# Patient Record
Sex: Male | Born: 1985 | Race: White | Hispanic: No | Marital: Single | Smoking: Current every day smoker
Health system: Southern US, Community
[De-identification: ages and names within clinical notes are randomized; demographics above are authoritative.]

## PROBLEM LIST (undated history)

## (undated) DIAGNOSIS — G4733 Obstructive sleep apnea (adult) (pediatric): Secondary | ICD-10-CM

## (undated) DIAGNOSIS — I10 Essential (primary) hypertension: Secondary | ICD-10-CM

## (undated) DIAGNOSIS — M25562 Pain in left knee: Secondary | ICD-10-CM

## (undated) DIAGNOSIS — R0683 Snoring: Secondary | ICD-10-CM

## (undated) HISTORY — DX: Morbid (severe) obesity due to excess calories: E66.01

## (undated) HISTORY — DX: Snoring: R06.83

## (undated) HISTORY — DX: Pain in left knee: M25.562

---

## 2016-08-08 ENCOUNTER — Ambulatory Visit: Payer: Self-pay | Admitting: Osteopathic Medicine

## 2016-08-10 ENCOUNTER — Ambulatory Visit (INDEPENDENT_AMBULATORY_CARE_PROVIDER_SITE_OTHER): Payer: Managed Care, Other (non HMO) | Admitting: Osteopathic Medicine

## 2016-08-10 ENCOUNTER — Encounter: Payer: Self-pay | Admitting: Osteopathic Medicine

## 2016-08-10 VITALS — BP 128/86 | HR 91 | Ht 67.0 in | Wt 282.0 lb

## 2016-08-10 DIAGNOSIS — Z131 Encounter for screening for diabetes mellitus: Secondary | ICD-10-CM | POA: Diagnosis not present

## 2016-08-10 DIAGNOSIS — Z23 Encounter for immunization: Secondary | ICD-10-CM | POA: Insufficient documentation

## 2016-08-10 DIAGNOSIS — Z13220 Encounter for screening for lipoid disorders: Secondary | ICD-10-CM | POA: Diagnosis not present

## 2016-08-10 DIAGNOSIS — Z0000 Encounter for general adult medical examination without abnormal findings: Secondary | ICD-10-CM | POA: Diagnosis not present

## 2016-08-10 DIAGNOSIS — M25562 Pain in left knee: Secondary | ICD-10-CM

## 2016-08-10 DIAGNOSIS — R0981 Nasal congestion: Secondary | ICD-10-CM | POA: Insufficient documentation

## 2016-08-10 DIAGNOSIS — R0683 Snoring: Secondary | ICD-10-CM

## 2016-08-10 DIAGNOSIS — Z113 Encounter for screening for infections with a predominantly sexual mode of transmission: Secondary | ICD-10-CM | POA: Insufficient documentation

## 2016-08-10 HISTORY — DX: Morbid (severe) obesity due to excess calories: E66.01

## 2016-08-10 HISTORY — DX: Pain in left knee: M25.562

## 2016-08-10 HISTORY — DX: Snoring: R06.83

## 2016-08-10 NOTE — Progress Notes (Signed)
HPI: Karen ChafeCharles Sobczak is a 30 y.o. male  who presents to [facility] [place] today, 08/10/16,  for chief complaint of:  Chief Complaint  Patient presents with  . Establish Care    Preventive care reviewed as below  Brief discussion of other medical issues:   Hx Left knee pain, popped in 2012, XR 2016 was negative in ER. Bother him intermittently. Mobic helped a bit for maybe a week.   Snoring and Sinus Congestion - thinks might be related, would like to know if anything he can take for sinuses.     Past medical, surgical, social and family history reviewed: History reviewed. No pertinent past medical history. History reviewed. No pertinent surgical history. Social History  Substance Use Topics  . Smoking status: Current Every Day Smoker    Types: Pipe  . Smokeless tobacco: Never Used  . Alcohol use Not on file   History reviewed. No pertinent family history.   Current medication list and allergy/intolerance information reviewed:   No current outpatient prescriptions on file.   No current facility-administered medications for this visit.    No Known Allergies    Review of Systems:  Constitutional:  No  fever, no chills, No recent illness, No unintentional weight changes. No significant fatigue.   HEENT: No  headache, no vision change, no hearing change, No sore throat, (+) sinus pressure  Cardiac: No  chest pain, No  pressure, No palpitations, No  Orthopnea  Respiratory:  No  shortness of breath. No  Cough  Gastrointestinal: No  abdominal pain, No  nausea, No  vomiting,  No  blood in stool, No  diarrhea, No  constipation   Musculoskeletal: No new myalgia/arthralgia  Genitourinary: No  incontinence, No  abnormal genital bleeding, No abnormal genital discharge  Skin: No  Rash, No other wounds/concerning lesions  Hem/Onc: No  easy bruising/bleeding, No  abnormal lymph node  Endocrine: No cold intolerance,  No heat intolerance. No  polyuria/polydipsia/polyphagia   Neurologic: No  weakness, No  dizziness, No  slurred speech/focal weakness/facial droop  Psychiatric: No  concerns with depression, No  concerns with anxiety, No sleep problems, No mood problems  Exam:  BP 128/86   Pulse 91   Ht 5\' 7"  (1.702 m)   Wt 282 lb (127.9 kg)   BMI 44.17 kg/m   Constitutional: VS see above. General Appearance: alert, well-developed, well-nourished, NAD  Eyes: Normal lids and conjunctive, non-icteric sclera  Ears, Nose, Mouth, Throat: MMM, Normal external inspection ears/nares/mouth/lips/gums. TM normal bilaterally. Pharynx/tonsils no erythema, no exudate. Nasal mucosa normal.   Neck: No masses, trachea midline. No thyroid enlargement. No tenderness/mass appreciated. No lymphadenopathy  Respiratory: Normal respiratory effort. no wheeze, no rhonchi, no rales  Cardiovascular: S1/S2 normal, no murmur, no rub/gallop auscultated. RRR. No lower extremity edema. Pedal pulse II/IV bilaterally DP and PT. No carotid bruit or JVD. No abdominal aortic bruit.  Gastrointestinal: Nontender, no masses. No hepatomegaly, no splenomegaly. No hernia appreciated. Bowel sounds normal. Rectal exam deferred.   Musculoskeletal: Gait normal. No clubbing/cyanosis of digits. L knee no crepitus, normal ROM, ligaments stable to ant/post drawers and varus/valgus stress  Neurological: No cranial nerve deficit on limited exam. Motor and sensation intact and symmetric. Cerebellar reflexes intact. Normal balance/coordination. No tremor.   Skin: warm, dry, intact. No rash/ulcer. No concerning nevi or subq nodules on limited exam.    Psychiatric: Normal judgment/insight. Normal mood and affect. Oriented x3.      ASSESSMENT/PLAN:   Annual physical exam -  Plan: CBC with Differential/Platelet, Hemoglobin A1c, COMPLETE METABOLIC PANEL WITH GFR, HIV antibody, Lipid panel, TSH  Morbid obesity, unspecified obesity type (HCC) - Plan: Hemoglobin A1c, Lipid panel,  TSH  Lipid screening  Diabetes mellitus screening  Snoring  Sinus congestion  Need for diphtheria-tetanus-pertussis (Tdap) vaccine, adult/adolescent - Plan: Tdap vaccine greater than or equal to 7yo IM  Routine screening for STI (sexually transmitted infection) - Plan: GC/Chlamydia Probe Amp  Left knee pain   Patient Instructions  Sinus: try Flonase or Nasonex or one of their generic equivalents, +/- Claritin or other over-the-counter antihistamine  Snoring: Let's see how sinus treatment helps, if snoring no better, may need to consider sleep study. Weight loss will help reduce snoring as well because it will reduce the pressure on your lungs and upper body.   Smoking: Come see [place] if you'd like help quitting!  Knee pain: If persists or gets worse, recommend follow up with one of our sport medicine docs, Dr Denyse Amass or Dr. Karie Schwalbe.      MALE PREVENTIVE CARE updated 08/10/16  ANNUAL SCREENING/COUNSELING  Any changes to health in the past year? no  Tobacco - yes - usually during   Alcohol - social drinker  Diet/Exercise - Healthy habits discussed to decrease CV risk and promote overall health. Patient does not have dietary restrictions.   Depression - PQH2 Negative  Feel safe at home? - yes  HTN SCREENING - SEE VITALS  SEXUAL/REPRODUCTIVE HEALTH  Sexually active? - Yes with male.  STI testing needed/desired today? - yes  Any concerns with testosterone/libido? - no  INFECTIOUS DISEASE SCREENING  HIV - needs  GC/CT - needs  HepC - does not need  TB - does not need  CANCER SCREENING  Lung - does not need  Colon - does not need  Prostate - does not need  OTHER DISEASE SCREENING  Lipid - needs  DM2 - needs  Osteoporosis - does not need  ADULT VACCINATION  Influenza - needs today, annual vaccine recommended  Td - was given  Zoster - was not indicated  Pneumonia - was not indicated     Visit summary with medication list and pertinent  instructions was printed for patient to review. All questions at time of visit were answered - patient instructed to contact office with any additional concerns. ER/RTC precautions were reviewed with the patient. Follow-up plan: Return in about 1 year (around 08/10/2017), or sooner if needed and/or if any concerning abnormalities on labs which require follow-up, for ANNUAL PHYSICAL.

## 2016-08-10 NOTE — Patient Instructions (Signed)
Sinus: try Flonase or Nasonex or one of their generic equivalents, +/- Claritin or other over-the-counter antihistamine  Snoring: Let's see how sinus treatment helps, if snoring no better, may need to consider sleep study. Weight loss will help reduce snoring as well because it will reduce the pressure on your lungs and upper body.   Smoking: Come see us if you'd like help quitting!  Knee pain: If persists or gets worse, recommend follow up with one of our sport medicine docs, Dr Denyse Amassorey or Dr. TKarie Schwalbe

## 2016-08-11 LAB — COMPLETE METABOLIC PANEL WITH GFR
ALBUMIN: 4.4 g/dL (ref 3.6–5.1)
ALK PHOS: 62 U/L (ref 40–115)
ALT: 32 U/L (ref 9–46)
AST: 20 U/L (ref 10–40)
BILIRUBIN TOTAL: 0.4 mg/dL (ref 0.2–1.2)
BUN: 15 mg/dL (ref 7–25)
CO2: 23 mmol/L (ref 20–31)
CREATININE: 0.99 mg/dL (ref 0.60–1.35)
Calcium: 9.4 mg/dL (ref 8.6–10.3)
Chloride: 106 mmol/L (ref 98–110)
GFR, Est African American: >89 mL/min (ref >=60)
GFR, Est Non African American: >89 mL/min (ref >=60)
GLUCOSE: 91 mg/dL (ref 65–99)
POTASSIUM: 4.1 mmol/L (ref 3.5–5.3)
SODIUM: 140 mmol/L (ref 135–146)
TOTAL PROTEIN: 6.9 g/dL (ref 6.1–8.1)

## 2016-08-11 LAB — CBC WITH DIFFERENTIAL/PLATELET
BASOS ABS: 93 {cells}/uL (ref 0–200)
BASOS PCT: 1 %
EOS ABS: 372 {cells}/uL (ref 15–500)
Eosinophils Relative: 4 %
HEMATOCRIT: 47.2 % (ref 38.5–50.0)
HEMOGLOBIN: 16.1 g/dL (ref 13.2–17.1)
Lymphocytes Relative: 30 %
Lymphs Abs: 2790 cells/uL (ref 850–3900)
MCH: 30.8 pg (ref 27.0–33.0)
MCHC: 34.1 g/dL (ref 32.0–36.0)
MCV: 90.4 fL (ref 80.0–100.0)
MONO ABS: 744 {cells}/uL (ref 200–950)
MPV: 13.1 fL — AB (ref 7.5–12.5)
Monocytes Relative: 8 %
NEUTROS ABS: 5301 {cells}/uL (ref 1500–7800)
Neutrophils Relative %: 57 %
Platelets: 187 10*3/uL (ref 140–400)
RBC: 5.22 MIL/uL (ref 4.20–5.80)
RDW: 13.1 % (ref 11.0–15.0)
WBC: 9.3 10*3/uL (ref 3.8–10.8)

## 2016-08-11 LAB — LIPID PANEL
Cholesterol: 167 mg/dL (ref 125–200)
HDL: 39 mg/dL — AB (ref >=40)
LDL CALC: 98 mg/dL (ref <130)
Total CHOL/HDL Ratio: 4.3 Ratio (ref <=5.0)
Triglycerides: 149 mg/dL (ref <150)
VLDL: 30 mg/dL (ref <30)

## 2016-08-11 LAB — GC/CHLAMYDIA PROBE AMP
CT PROBE, AMP APTIMA: NOT DETECTED
GC Probe RNA: NOT DETECTED

## 2016-08-11 LAB — HEMOGLOBIN A1C
Hgb A1c MFr Bld: 4.9 % (ref <5.7)
Mean Plasma Glucose: 94 mg/dL

## 2016-08-11 LAB — HIV ANTIBODY (ROUTINE TESTING W REFLEX): HIV 1&2 Ab, 4th Generation: NONREACTIVE

## 2016-08-11 LAB — TSH: TSH: 2.92 m[IU]/L (ref 0.40–4.50)

## 2017-02-13 ENCOUNTER — Telehealth: Payer: Self-pay | Admitting: Osteopathic Medicine

## 2017-02-13 NOTE — Telephone Encounter (Signed)
Pt declined flu shot °

## 2017-09-04 ENCOUNTER — Ambulatory Visit (INDEPENDENT_AMBULATORY_CARE_PROVIDER_SITE_OTHER): Payer: Managed Care, Other (non HMO) | Admitting: Osteopathic Medicine

## 2017-09-04 ENCOUNTER — Encounter: Payer: Self-pay | Admitting: Osteopathic Medicine

## 2017-09-04 VITALS — BP 145/87 | HR 96 | Temp 98.5°F | Ht 67.0 in | Wt 285.0 lb

## 2017-09-04 DIAGNOSIS — R9431 Abnormal electrocardiogram [ECG] [EKG]: Secondary | ICD-10-CM | POA: Diagnosis not present

## 2017-09-04 DIAGNOSIS — R0789 Other chest pain: Secondary | ICD-10-CM

## 2017-09-04 DIAGNOSIS — J208 Acute bronchitis due to other specified organisms: Secondary | ICD-10-CM | POA: Diagnosis not present

## 2017-09-04 LAB — TSH: TSH: 2.63 mIU/L (ref 0.40–4.50)

## 2017-09-04 LAB — COMPLETE METABOLIC PANEL WITH GFR
AG RATIO: 1.8 (calc) (ref 1.0–2.5)
ALBUMIN MSPROF: 4.6 g/dL (ref 3.6–5.1)
ALT: 44 U/L (ref 9–46)
AST: 21 U/L (ref 10–40)
Alkaline phosphatase (APISO): 68 U/L (ref 40–115)
BUN: 10 mg/dL (ref 7–25)
CALCIUM: 10.1 mg/dL (ref 8.6–10.3)
CO2: 28 mmol/L (ref 20–32)
CREATININE: 0.92 mg/dL (ref 0.60–1.35)
Chloride: 100 mmol/L (ref 98–110)
GFR, EST NON AFRICAN AMERICAN: 109 mL/min/{1.73_m2} (ref >=60)
GFR, Est African American: 126 mL/min/{1.73_m2} (ref >=60)
GLOBULIN: 2.5 g/dL (ref 1.9–3.7)
Glucose, Bld: 89 mg/dL (ref 65–99)
POTASSIUM: 4.5 mmol/L (ref 3.5–5.3)
SODIUM: 138 mmol/L (ref 135–146)
Total Bilirubin: 0.7 mg/dL (ref 0.2–1.2)
Total Protein: 7.1 g/dL (ref 6.1–8.1)

## 2017-09-04 LAB — CBC WITH DIFFERENTIAL/PLATELET
BASOS ABS: 82 {cells}/uL (ref 0–200)
Basophils Relative: 0.6 %
Eosinophils Absolute: 247 cells/uL (ref 15–500)
Eosinophils Relative: 1.8 %
HEMATOCRIT: 49.5 % (ref 38.5–50.0)
HEMOGLOBIN: 17 g/dL (ref 13.2–17.1)
LYMPHS ABS: 1754 {cells}/uL (ref 850–3900)
MCH: 31 pg (ref 27.0–33.0)
MCHC: 34.3 g/dL (ref 32.0–36.0)
MCV: 90.3 fL (ref 80.0–100.0)
MPV: 13.2 fL — ABNORMAL HIGH (ref 7.5–12.5)
Monocytes Relative: 8.9 %
NEUTROS ABS: 10398 {cells}/uL — AB (ref 1500–7800)
NEUTROS PCT: 75.9 %
Platelets: 206 10*3/uL (ref 140–400)
RBC: 5.48 10*6/uL (ref 4.20–5.80)
RDW: 12.1 % (ref 11.0–15.0)
Total Lymphocyte: 12.8 %
WBC: 13.7 10*3/uL — ABNORMAL HIGH (ref 3.8–10.8)
WBCMIX: 1219 {cells}/uL — AB (ref 200–950)

## 2017-09-04 LAB — TROPONIN I: Troponin I: <0.01 ng/mL (ref <=0.0)

## 2017-09-04 MED ORDER — GUAIFENESIN-CODEINE 100-10 MG/5ML PO SOLN: 5.0000 mL | Freq: Four times a day (QID) | ORAL | 0 refills | Status: DC | PRN

## 2017-09-04 MED ORDER — IPRATROPIUM BROMIDE 0.06 % NA SOLN: 2.0000 | Freq: Four times a day (QID) | NASAL | 12 refills | Status: DC

## 2017-09-04 MED ORDER — PREDNISONE 20 MG PO TABS: 20.0000 mg | ORAL_TABLET | Freq: Two times a day (BID) | ORAL | 0 refills | Status: DC

## 2017-09-04 NOTE — Progress Notes (Signed)
HPI: Blake Mcdaniel is a 31 y.o. male who presents to [facility] 09/04/17 for chief complaint of:  Chief Complaint  Patient presents with  . Cough    Acute Illness: . Context: similar illness in partner . Location/Quality: coughing, mostly dry, occasional phlegm . Assoc signs/symptoms: chest pain radiating into L arm last night, he states this pain comes and goes past few months  . Duration: 1 days . Modifying factors: has tried the following OTC/Rx medications: Tylenol Cold.    Past medical, social and family history reviewed.  Immune compromising conditions or other risk factors: smoker  Current medications and allergies reviewed.     Review of Systems:  Constitutional: No  fever/chills  HEENT: sinue headache, No  sore throat, No  swollen glands  Cardiovascular: +chest pain  Respiratory:Yes  cough, No  shortness of breath  Gastrointestinal: No  nausea, No  vomiting,  No  diarrhea  Musculoskeletal:   No  myalgia/arthralgia  Skin/Integument:  No  rash   Detailed Exam:  BP (!) 145/87   Pulse 96   Temp 98.5 F (36.9 C) (Oral)   Ht  (1.702 m)   Wt 285 lb (129.3 kg)   SpO2 98%   BMI 44.64 kg/m   Constitutional:   VSS, see above.   General Appearance: alert, well-developed, well-nourished, NAD  Eyes:   Normal lids and conjunctive, non-icteric sclera  Ears, Nose, Mouth, Throat:   Normal external inspection ears/nares  Normal mouth/lips/gums, MMM  normal TM  posterior pharynx without erythema, without exudate  nasal mucosa normal  Skin:  Normal inspection, no rash or concerning lesions noted on limited exam  Neck:   No masses, trachea midline. normal lymph nodes  Respiratory:   Normal respiratory effort.   No  wheeze/rhonchi/rales  Cardiovascular:   S1/S2 normal, no murmur/rub/gallop auscultated. RRR.    EKG interpretation: Rate: 94 Rhythm: sinus No previous EKG available for  comparison (+)T inversion I, aVL       ASSESSMENT/PLAN:  Acute viral bronchitis - Plan: ipratropium (ATROVENT) 0.06 % nasal spray, predniSONE (DELTASONE) 20 MG tablet, guaiFENesin-codeine 100-10 MG/5ML syrup  Abnormal EKG - Plan: Troponin I, Ambulatory referral to Cardiology  Other chest pain - Plan: CBC with Differential/Platelet, COMPLETE METABOLIC PANEL WITH GFR, TSH, EKG 12-Lead     Patient Instructions  Plan:  Viral bronchitis - treat symptoms and wait for illness to improve on its own in 5-7 days. Cough medicine, steroids and nasal spray. See below for list of OTC treatments and other home remedies. WASH HANDS OFTEN, ALWAYS COVER COUGH!   EKG today not totally normal, but I don't have any other EKG's on file for you for a comparison. Since you've been experiencing chest pain on and off, I'm going to place a referral to cardiology to evaluate for stress test and we should get some labs today. If labs are abnormal, or if you experience worsening chest pains, go to ER immediately!     Over-the-Counter Medications & Home Remedies for Upper Respiratory Illness  Note: the following list assumes no pregnancy, normal liver & kidney function and no other drug interactions. Dr. Lyn Hollingshead has highlighted medications which are safe for you to use, but these may not be appropriate for everyone. Always ask a pharmacist or qualified medical provider if you have any questions!   Aches/Pains, Fever, Headache Acetaminophen (Tylenol) 500 mg tablets - take max 2 tablets (1000 mg) every 6 hours (4 times per day)  Ibuprofen (Motrin)  200 mg tablets - take max 4 tablets (800 mg) every 6 hours*  Sinus Congestion Prescription Atrovent as directed Nasal Saline if desired - for rinsing Oxymetolazone (Afrin, others) sparing use due to rebound congestion, NEVER use in kids Phenylephrine (Sudafed) 10 mg tablets every 4 hours (or the 12-hour formulation)* Diphenhydramine (Benadryl) 25 mg tablets - take  max 2 tablets every 4 hours  Cough & Sore Throat Prescription cough pills or syrups as directed OR Dextromethorphan (Robitussin, others) - cough suppressant Guaifenesin (Robitussin, Mucinex, others) - expectorant (helps cough up mucus) (Dextromethorphan and Guaifenesin also come in a combination tablet) Other helpful cough remedies:  Lozenges w/ Benzocaine + Menthol (Cepacol) Honey - as much as you want! Teas which "coat the throat" - look for ingredients Elm Bark, Licorice Root, Marshmallow Root  Other Antibiotics if these are prescribed - take ALL, even if you're feeling better  Zinc Lozenges within 24 hours of symptoms onset - mixed evidence this shortens the duration of the common cold Don't waste your money on Vitamin C or Echinacea  *Caution in patients with high blood pressure       Visit summary was printed for the patient with medications and pertinent instructions for patient to review. ER/RTC precautions reviewed. All questions answered. Return if symptoms worsen or fail to improve, and recheck chest symptoms/EKG in one week .

## 2017-09-04 NOTE — Patient Instructions (Addendum)
Plan:  Viral bronchitis - treat symptoms and wait for illness to improve on its own in 5-7 days. Cough medicine, steroids and nasal spray. See below for list of OTC treatments and other home remedies. WASH HANDS OFTEN, ALWAYS COVER COUGH!   EKG today not totally normal, but I don't have any other EKG's on file for you for a comparison. Since you've been experiencing chest pain on and off, I'm going to place a referral to cardiology to evaluate for stress test and we should get some labs today. If labs are abnormal, or if you experience worsening chest pains, go to ER immediately!     Over-the-Counter Medications & Home Remedies for Upper Respiratory Illness  Note: the following list assumes no pregnancy, normal liver & kidney function and no other drug interactions. Dr. Lyn Hollingshead has highlighted medications which are safe for you to use, but these may not be appropriate for everyone. Always ask a pharmacist or qualified medical provider if you have any questions!   Aches/Pains, Fever, Headache Acetaminophen (Tylenol) 500 mg tablets - take max 2 tablets (1000 mg) every 6 hours (4 times per day)  Ibuprofen (Motrin) 200 mg tablets - take max 4 tablets (800 mg) every 6 hours*  Sinus Congestion Prescription Atrovent as directed Nasal Saline if desired - for rinsing Oxymetolazone (Afrin, others) sparing use due to rebound congestion, NEVER use in kids Phenylephrine (Sudafed) 10 mg tablets every 4 hours (or the 12-hour formulation)* Diphenhydramine (Benadryl) 25 mg tablets - take max 2 tablets every 4 hours  Cough & Sore Throat Prescription cough pills or syrups as directed OR Dextromethorphan (Robitussin, others) - cough suppressant Guaifenesin (Robitussin, Mucinex, others) - expectorant (helps cough up mucus) (Dextromethorphan and Guaifenesin also come in a combination tablet) Other helpful cough remedies:  Lozenges w/ Benzocaine + Menthol (Cepacol) Honey - as much as you want! Teas which  "coat the throat" - look for ingredients Elm Bark, Licorice Root, Marshmallow Root  Other Antibiotics if these are prescribed - take ALL, even if you're feeling better  Zinc Lozenges within 24 hours of symptoms onset - mixed evidence this shortens the duration of the common cold Don't waste your money on Vitamin C or Echinacea  *Caution in patients with high blood pressure

## 2017-09-11 ENCOUNTER — Ambulatory Visit: Payer: Managed Care, Other (non HMO) | Admitting: Cardiology

## 2018-01-17 ENCOUNTER — Ambulatory Visit (INDEPENDENT_AMBULATORY_CARE_PROVIDER_SITE_OTHER): Payer: Managed Care, Other (non HMO) | Admitting: Osteopathic Medicine

## 2018-01-17 ENCOUNTER — Encounter: Payer: Self-pay | Admitting: Osteopathic Medicine

## 2018-01-17 VITALS — BP 128/87 | HR 92 | Temp 98.1°F | Wt 303.0 lb

## 2018-01-17 DIAGNOSIS — J208 Acute bronchitis due to other specified organisms: Secondary | ICD-10-CM

## 2018-01-17 DIAGNOSIS — Z720 Tobacco use: Secondary | ICD-10-CM

## 2018-01-17 DIAGNOSIS — J069 Acute upper respiratory infection, unspecified: Secondary | ICD-10-CM | POA: Diagnosis not present

## 2018-01-17 DIAGNOSIS — B9789 Other viral agents as the cause of diseases classified elsewhere: Secondary | ICD-10-CM

## 2018-01-17 MED ORDER — GUAIFENESIN-CODEINE 100-10 MG/5ML PO SOLN: 5.0000 mL | Freq: Four times a day (QID) | ORAL | 0 refills | Status: DC | PRN

## 2018-01-17 MED ORDER — AZITHROMYCIN 250 MG PO TABS: ORAL_TABLET | ORAL | 0 refills | Status: DC

## 2018-01-17 MED ORDER — IPRATROPIUM BROMIDE 0.06 % NA SOLN: 2.0000 | Freq: Four times a day (QID) | NASAL | 1 refills | Status: DC

## 2018-01-17 MED ORDER — PREDNISONE 20 MG PO TABS: 20.0000 mg | ORAL_TABLET | Freq: Two times a day (BID) | ORAL | 0 refills | Status: DC

## 2018-01-17 NOTE — Patient Instructions (Signed)
Acute Bronchitis, Adult Acute bronchitis is sudden (acute) swelling of the air tubes (bronchi) in the lungs. Acute bronchitis causes these tubes to fill with mucus, which can make it hard to breathe. It can also cause coughing or wheezing. In adults, acute bronchitis usually goes away within 2 weeks. A cough caused by bronchitis may last up to 3 weeks. Smoking, allergies, and asthma can make the condition worse. Repeated episodes of bronchitis may cause further lung problems, such as chronic obstructive pulmonary disease (COPD). What are the causes? This condition can be caused by germs and by substances that irritate the lungs, including:  Cold and flu viruses. This condition is most often caused by the same virus that causes a cold.  Bacteria.  Exposure to tobacco smoke, dust, fumes, and air pollution.  What increases the risk? This condition is more likely to develop in people who:  Have close contact with someone with acute bronchitis.  Are exposed to lung irritants, such as tobacco smoke, dust, fumes, and vapors.  Have a weak immune system.  Have a respiratory condition such as asthma.  What are the signs or symptoms? Symptoms of this condition include:  A cough.  Coughing up clear, yellow, or green mucus.  Wheezing.  Chest congestion.  Shortness of breath.  A fever.  Body aches.  Chills.  A sore throat.  How is this diagnosed?  This condition is usually diagnosed with a physical exam. During the exam, your health care provider may order tests, such as chest X-rays, to rule out other conditions.   Your health care provider will also ask about your symptoms and medical history. How is this treated? Most cases of acute bronchitis clear up over time without treatment. Your health care provider may recommend:  Drinking more fluids. Drinking more makes your mucus thinner, which may make it easier to breathe.  Taking a medicine for a fever or cough.  Taking an  antibiotic medicine.  Using an inhaler to help improve shortness of breath and to control a cough.  Using a cool mist vaporizer or humidifier to make it easier to breathe.  Follow these instructions at home: Medicines  Take over-the-counter and prescription medicines only as told by your health care provider.  If you were prescribed an antibiotic, take it as told by your health care provider. Do not stop taking the antibiotic even if you start to feel better. General instructions  Get plenty of rest.  Drink enough fluids to keep your urine clear or pale yellow.  Avoid smoking and secondhand smoke. Exposure to cigarette smoke or irritating chemicals will make bronchitis worse. If you smoke and you need help quitting, ask your health care provider. Quitting smoking will help your lungs heal faster.  Use an inhaler, cool mist vaporizer, or humidifier as told by your health care provider.  Keep all follow-up visits as told by your health care provider. This is important. How is this prevented? To lower your risk of getting this condition again:  Wash your hands often with soap and water. If soap and water are not available, use hand sanitizer.  Avoid contact with people who have cold symptoms.  Try not to touch your hands to your mouth, nose, or eyes.  Make sure to get the flu shot every year.  Contact a health care provider if:  Your symptoms do not improve in 2 weeks of treatment. Get help right away if:  You cough up blood.  You have chest pain.  You  have severe shortness of breath.  You become dehydrated.  You faint or keep feeling like you are going to faint.  You keep vomiting.  You have a severe headache.  Your fever or chills gets worse. This information is not intended to replace advice given to you by your health care provider. Make sure you discuss any questions you have with your health care provider. Document Released: 01/11/2005 Document Revised:  06/28/2016 Document Reviewed: 05/24/2016 [employer] Interactive Patient Education  [employer]2018 [employer]

## 2018-01-17 NOTE — Progress Notes (Signed)
HPI: Blake Mcdaniel is a 32 y.o. male who  has a past medical history of Left knee pain (08/10/2016), Morbid obesity (HCC) (08/10/2016), and Snoring (08/10/2016).  he presents to [facility] today, 01/17/18,  for chief complaint of: Feeling sick  Cough for a few days, sick contacts at work. Nasal congestion. No fever that he has measured. Still (+)smoking 1/2 ppd. Sore throat, sinus congestion.     Past medical, surgical, social and family history reviewed:  Patient Active Problem List   Diagnosis Date Noted  . Annual physical exam 08/10/2016  . Morbid obesity (HCC) 08/10/2016  . Snoring 08/10/2016  . Left knee pain 08/10/2016   No past surgical history on file.  Social History   Tobacco Use  . Smoking status: Current Every Day Smoker    Types: Pipe  . Smokeless tobacco: Never Used  Substance Use Topics  . Alcohol use: Not on file    Family History  Problem Relation Age of Onset  . Heart attack Maternal Uncle   . Diabetes Maternal Grandfather      Current medication list and allergy/intolerance information reviewed.     Review of Systems:  Constitutional:  No  fever, +chills, +recent illness, No unintentional weight changes. +significant fatigue.   HEENT: No  headache, no vision change, no hearing change, +sore throat, +sinus pressure  Cardiac: No  chest pain, No  pressure, No palpitations  Respiratory:  No  shortness of breath. +Cough  Gastrointestinal: No  abdominal pain, No  nausea, No  vomiting,  No  blood in stool, No  diarrhea  Musculoskeletal: No new myalgia/arthralgia  Skin: No  Rash  Neurologic: No  weakness, No  dizziness  Exam:  BP 128/87 (BP Location: Left Arm)   Pulse 92   Temp 98.1 F (36.7 C) (Oral)   Wt (!) 303 lb 0.6 oz (137.5 kg)   BMI 47.46 kg/m   Constitutional: VS see above. General Appearance: alert, well-developed, well-nourished, NAD  Eyes: Normal lids and conjunctive, non-icteric  sclera  Ears, Nose, Mouth, Throat: MMM, Normal external inspection ears/nares/mouth/lips/gums. TM normal bilaterally. Pharynx/tonsils no erythema, no exudate. Nasal mucosa normal.   Neck: No masses, trachea midline. No tenderness/mass appreciated. No lymphadenopathy  Respiratory: Normal respiratory effort. Faint expiratory wheeze in all lung fields, no rhonchi, no rales  Cardiovascular: S1/S2 normal, no murmur, no rub/gallop auscultated. RRR. No lower extremity edema.   Musculoskeletal: Gait normal.   Neurological: Normal balance/coordination. No tremor.   Skin: warm, dry, intact.   Psychiatric: Normal judgment/insight. Normal mood and affect.     ASSESSMENT/PLAN: The primary encounter diagnosis was Viral URI with cough. Diagnoses of Acute viral bronchitis and Tobacco abuse were also pertinent to this visit.  Supportive care as virus runs its course, wheezing likely bronchitis complicated by smoker's lung, advised option to do CXR but also reasonable to defer this for now.   Meds ordered this encounter  Medications  . guaiFENesin-codeine 100-10 MG/5ML syrup    Sig: Take 5 mLs by mouth every 6 (six) hours as needed for cough.    Dispense:  118 mL    Refill:  0  . ipratropium (ATROVENT) 0.06 % nasal spray    Sig: Place 2 sprays into both nostrils 4 (four) times daily.    Dispense:  15 mL    Refill:  1  . predniSONE (DELTASONE) 20 MG tablet    Sig: Take 1 tablet (20 mg total) by mouth 2 (two) times daily with a  meal.    Dispense:  10 tablet    Refill:  0  . azithromycin (ZITHROMAX) 250 MG tablet    Sig: 2 tabs po x1 on Day 1, then 1 tab po daily on Days 2 - 5. Fill if cough persists past 10 days, or worsens. VOID after 01/31/18    Dispense:  6 tablet    Refill:  0    Patient Instructions  Acute Bronchitis, Adult Acute bronchitis is sudden (acute) swelling of the air tubes (bronchi) in the lungs. Acute bronchitis causes these tubes to fill with mucus, which can make it hard  to breathe. It can also cause coughing or wheezing. In adults, acute bronchitis usually goes away within 2 weeks. A cough caused by bronchitis may last up to 3 weeks. Smoking, allergies, and asthma can make the condition worse. Repeated episodes of bronchitis may cause further lung problems, such as chronic obstructive pulmonary disease (COPD). What are the causes? This condition can be caused by germs and by substances that irritate the lungs, including:  Cold and flu viruses. This condition is most often caused by the same virus that causes a cold.  Bacteria.  Exposure to tobacco smoke, dust, fumes, and air pollution.  What increases the risk? This condition is more likely to develop in people who:  Have close contact with someone with acute bronchitis.  Are exposed to lung irritants, such as tobacco smoke, dust, fumes, and vapors.  Have a weak immune system.  Have a respiratory condition such as asthma.  What are the signs or symptoms? Symptoms of this condition include:  A cough.  Coughing up clear, yellow, or green mucus.  Wheezing.  Chest congestion.  Shortness of breath.  A fever.  Body aches.  Chills.  A sore throat.  How is this diagnosed?  This condition is usually diagnosed with a physical exam. During the exam, your health care provider may order tests, such as chest X-rays, to rule out other conditions.   Your health care provider will also ask about your symptoms and medical history. How is this treated? Most cases of acute bronchitis clear up over time without treatment. Your health care provider may recommend:  Drinking more fluids. Drinking more makes your mucus thinner, which may make it easier to breathe.  Taking a medicine for a fever or cough.  Taking an antibiotic medicine.  Using an inhaler to help improve shortness of breath and to control a cough.  Using a cool mist vaporizer or humidifier to make it easier to breathe.  Follow  these instructions at home: Medicines  Take over-the-counter and prescription medicines only as told by your health care provider.  If you were prescribed an antibiotic, take it as told by your health care provider. Do not stop taking the antibiotic even if you start to feel better. General instructions  Get plenty of rest.  Drink enough fluids to keep your urine clear or pale yellow.  Avoid smoking and secondhand smoke. Exposure to cigarette smoke or irritating chemicals will make bronchitis worse. If you smoke and you need help quitting, ask your health care provider. Quitting smoking will help your lungs heal faster.  Use an inhaler, cool mist vaporizer, or humidifier as told by your health care provider.  Keep all follow-up visits as told by your health care provider. This is important. How is this prevented? To lower your risk of getting this condition again:  Wash your hands often with soap and water. If soap and  water are not available, use hand sanitizer.  Avoid contact with people who have cold symptoms.  Try not to touch your hands to your mouth, nose, or eyes.  Make sure to get the flu shot every year.  Contact a health care provider if:  Your symptoms do not improve in 2 weeks of treatment. Get help right away if:  You cough up blood.  You have chest pain.  You have severe shortness of breath.  You become dehydrated.  You faint or keep feeling like you are going to faint.  You keep vomiting.  You have a severe headache.  Your fever or chills gets worse. This information is not intended to replace advice given to you by your health care provider. Make sure you discuss any questions you have with your health care provider. Document Released: 01/11/2005 Document Revised: 06/28/2016 Document Reviewed: 05/24/2016 Elsevier Interactive Patient Education  2018 [employer]     Visit summary with medication list and pertinent instructions was printed for  patient to review. All questions at time of visit were answered - patient instructed to contact office with any additional concerns. ER/RTC precautions were reviewed with the patient.   Follow-up plan: Return if symptoms worsen or fail to improve.  Note: Total time spent 25 minutes, greater than 50% of the visit was spent face-to-face counseling and coordinating care for the following: The primary encounter diagnosis was Viral URI with cough. Diagnoses of Acute viral bronchitis and Tobacco abuse were also pertinent to this visit.Marland Kitchen.  Please note: voice recognition software was used to produce this document, and typos may escape review. Please contact Dr. Lyn HollingsheadAlexander for any needed clarifications.

## 2019-09-25 ENCOUNTER — Ambulatory Visit (INDEPENDENT_AMBULATORY_CARE_PROVIDER_SITE_OTHER): Payer: Managed Care, Other (non HMO) | Admitting: Osteopathic Medicine

## 2019-09-25 ENCOUNTER — Encounter: Payer: Self-pay | Admitting: Osteopathic Medicine

## 2019-09-25 ENCOUNTER — Other Ambulatory Visit: Payer: Self-pay

## 2019-09-25 ENCOUNTER — Ambulatory Visit (INDEPENDENT_AMBULATORY_CARE_PROVIDER_SITE_OTHER): Payer: 59

## 2019-09-25 VITALS — BP 153/102 | HR 92 | Temp 98.1°F | Wt 305.9 lb

## 2019-09-25 DIAGNOSIS — M25571 Pain in right ankle and joints of right foot: Secondary | ICD-10-CM | POA: Insufficient documentation

## 2019-09-25 DIAGNOSIS — M76811 Anterior tibial syndrome, right leg: Secondary | ICD-10-CM

## 2019-09-25 DIAGNOSIS — Z6841 Body Mass Index (BMI) 40.0 and over, adult: Secondary | ICD-10-CM

## 2019-09-25 DIAGNOSIS — R0683 Snoring: Secondary | ICD-10-CM

## 2019-09-25 DIAGNOSIS — R030 Elevated blood-pressure reading, without diagnosis of hypertension: Secondary | ICD-10-CM

## 2019-09-25 DIAGNOSIS — Z9189 Other specified personal risk factors, not elsewhere classified: Secondary | ICD-10-CM | POA: Diagnosis not present

## 2019-09-25 DIAGNOSIS — R5383 Other fatigue: Secondary | ICD-10-CM

## 2019-09-25 MED ORDER — PREDNISONE 10 MG (48) PO TBPK: ORAL_TABLET | Freq: Every day | ORAL | 0 refills | Status: DC

## 2019-09-25 MED ORDER — NAPROXEN 500 MG PO TABS: 500.0000 mg | ORAL_TABLET | Freq: Two times a day (BID) | ORAL | 1 refills | Status: DC

## 2019-09-25 NOTE — Progress Notes (Signed)
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  Symptoms are moderate, persistent, localized without radiation, no trauma, no mechanical symptoms, no constitutional symptoms.  I reviewed the past medical history, family history, social history, surgical history, and allergies today and no changes were needed.  Please see the problem list section below in epic for further details.  Past Medical History: Past Medical History:  Diagnosis Date  . Left knee pain 08/10/2016  . Morbid obesity (HCC) 08/10/2016  . Snoring 08/10/2016   Past Surgical History: No past surgical history on file. Social History: Social History   Socioeconomic History  . Marital status: Single    Spouse name: Not on file  . Number of children: Not on file  . Years of education: Not on file  . Highest education level: Not on file  Occupational History  . Not on file  Social Needs  . Financial resource strain: Not on file  . Food insecurity    Worry: Not on file    Inability: Not on file  . Transportation needs    Medical: Not on file    Non-medical: Not on file  Tobacco Use  . Smoking status: Current Every Day Smoker    Packs/day: 0.50    Types: Pipe  . Smokeless tobacco: Never Used  Substance and Sexual Activity  . Alcohol use: Not on file  . Drug use: Not on file  . Sexual activity: Yes    Partners: Female    Birth control/protection: Condom  Lifestyle  . Physical activity    Days per week: Not on file    Minutes per session: Not on file  . Stress: Not on file  Relationships  . Social Musician on phone: Not on file    Gets together: Not on file    Attends religious service: Not on file    Active member of club or organization: Not on file    Attends meetings of clubs or organizations: Not on file     Relationship status: Not on file  Other Topics Concern  . Not on file  Social History Narrative  . Not on file   Family History: Family History  Problem Relation Age of Onset  . Heart attack Maternal Uncle   . Diabetes Maternal Grandfather    Allergies: No Known Allergies Medications: See med rec.  Review of Systems: No fevers, chills, night sweats, weight loss, chest pain, or shortness of breath.   Objective:    General: Well Developed, well nourished, and in no acute distress.  Neuro: Alert and oriented x3, extra-ocular muscles intact, sensation grossly intact.  HEENT: Normocephalic, atraumatic, pupils equal round reactive to light, neck supple, no masses, no lymphadenopathy, thyroid nonpalpable.  Skin: Warm and dry, no rashes. Cardiac: Regular rate and rhythm, no murmurs rubs or gallops, no lower extremity edema.  Respiratory: Clear to auscultation bilaterally. Not using accessory muscles, speaking in full sentences. Right ankle: No visible erythema or swelling. Range of motion is full in all directions. Strength is 5/5 in all directions. Stable lateral and medial ligaments; squeeze test and kleiger test unremarkable; Tender to palpation over the medial talar dome, no tenderness over the tibialis anterior tendon and no reproduction of pain with resisted dorsiflexion. Pain is reproducible with passive and active plantar  flexion. No pain at base of 5th MT; No tenderness over cuboid; No tenderness over N spot or navicular prominence No tenderness on posterior aspects of lateral and medial malleolus No sign of peroneal tendon subluxations; Negative tarsal tunnel tinel's Able to walk 4 steps.  Impression and Recommendations:    Right ankle pain Pain is discretely over the medial talar dome more so than the tibialis anterior, x-rays, compression, several days of prednisone followed by naproxen. Trace out the alphabet every night. Return to see me in 2 weeks, we will likely  get an MRI or proceed with tibiotalar injection if no better.   ___________________________________________ Gwen Her. Dianah Field, M.D., ABFM., CAQSM. Primary Care and Sports Medicine [facility]  Adjunct Professor of [employer]

## 2019-09-25 NOTE — Assessment & Plan Note (Signed)
Pain is discretely over the medial talar dome more so than the tibialis anterior, x-rays, compression, several days of prednisone followed by naproxen. Trace out the alphabet every night. Return to see me in 2 weeks, we will likely get an MRI or proceed with tibiotalar injection if no better.

## 2019-09-25 NOTE — Progress Notes (Signed)
HPI: Blake Mcdaniel is a 33 y.o. male who  has a past medical history of Left knee pain (08/10/2016), Morbid obesity ([place]) (08/10/2016), and Snoring (08/10/2016).  he presents to [facility] today, 09/25/19,  for chief complaint of: foot pain and sleep apnea  Foot pain - Patient reports tightness with plantar flexion and tenderness on the medial side of the R ankle beginning Monday. Pain worsened throughout the week and climbed up to proximal to ankle.  Thought he may have had truckers foot and made adjustments to seat. This reduced pain slightly but still has that tenderness. 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  Never has had anything like this before. 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 Aggravated with exaggerated plantar flexion. Does not hurt when walking. No pain in left foot.  Used old meloxicam, didn't seem to help. No trauma/injury.   Sleep Apnea - Patient has been told that he "can vibrate an entire house". Fiance said he execessively snores and seems he stops breathing sometimes. Has felt fiance wake him up to roll over to make the snoring better. Has been going on at least 5 years. Falling asleep during day. Dad's on CPAP - not sure what for.  - Smoking history is 10 years. 1/2 ppd now, has been cutting back, thinks he needs to stop at some point. Intends to quit. Does not want resources because he knows them.  - Has woken up choking due to mucus build up but takes allergy medication which makes it better. Has some chest pressure when he first wakes up. No SOB, dizziness, headache, vision changes.    STOP-BANG for SLEEP APNEA Do you Snore loudly? Yes Do you often feel Tired during day? Yes Has anyone Observed you stop breathing? Yes History of high blood Pressure? Yes BMI >35? Yes Age >50? No Neck circumference >16 in? Yes Gender male? Yes  Score: 6 5-8 = high risk 3-4 = intermediate 0-2 = low risk      Past medical, surgical, social and family history reviewed and updated as necessary.   Current medication list and allergy/intolerance information reviewed:    Current Outpatient Medications  Medication Sig Dispense Refill  . azithromycin (ZITHROMAX) 250 MG tablet 2 tabs po x1 on Day 1, then 1 tab po daily on Days 2 - 5. Fill if cough persists past 10 days, or worsens. VOID after 01/31/18 (Patient not taking: Reported on 09/25/2019) 6 tablet 0  . guaiFENesin-codeine 100-10 MG/5ML syrup Take 5 mLs by mouth every 6 (six) hours as needed for cough. (Patient not taking: Reported on 09/25/2019) 118 mL 0  . ipratropium (ATROVENT) 0.06 % nasal spray Place 2 sprays into both nostrils 4 (four) times daily. (Patient not taking: Reported on 09/25/2019) 15 mL 1  . predniSONE (DELTASONE) 20 MG tablet Take 1 tablet (20 mg total) by mouth 2 (two) times daily with a meal. (Patient not taking: Reported on 09/25/2019) 10 tablet 0   No current facility-administered medications for this visit.     No Known Allergies    Review of Systems:  Constitutional:  Daytime fatigue and sleepiness.   HEENT: No  headache, no vision change  Cardiac: Mild chest pressure per HPI but nothing on exertion. No  chest pain, No palpitations  Respiratory:  No  shortness of breath.   Gastrointestinal: No  abdominal pain, No  nausea, No  vomiting  Musculoskeletal: Pain in medial ankle.   Neurologic:  No  dizziness  Exam:  BP (!) 153/102 (BP Location: Right Arm, Patient Position: Sitting, Cuff Size: Large)   Pulse 92   Temp 98.1 F (36.7 C) (Oral)   Wt (!) 305 lb 14.4 oz (138.8 kg)   BMI 47.91 kg/m   Constitutional: VS see above. General Appearance: alert, well-developed, well-nourished, NAD  Eyes: Normal lids and conjunctive, non-icteric sclera  Neck: No masses, trachea midline.   Respiratory: Normal respiratory effort. CTABL. no wheeze, no rhonchi, no rales  Cardiovascular: S1/S2 normal, no murmur, no  rub/gallop auscultated. RRR.   Musculoskeletal: Gait normal.   Ankle on R: tender at anterir tibial ligament / taler ridge, neg drawer test, no pain with inversn/eversion, no pain w/ resited dorsiflexion or plantarflexion, really ony tedner to palpation -a apprecite curbside from Dr T see his note for full details   Psychiatric: Normal judgment/insight. Normal mood and affect.   No results found for this or any previous visit (from the past 72 hour(s)).  No results found.   ASSESSMENT/PLAN: The primary encounter diagnosis was Acute right ankle pain. Diagnoses of Anterior tibialis tendinitis of right lower extremity, Risk factors for obstructive sleep apnea, Elevated blood pressure reading, Fatigue, unspecified type, Snoring, and BMI 40.0-44.9, adult (HCC) were also pertinent to this visit.   Blake Mcdaniel is a 33 y.o. M who presents with concerns for sleep apnea and a 4 day history of foot pain.   Foot Pain - Foot pain likely due to inflammation in the tibialis anterior ? Talar joint from repetitive motion during driving. Wills start prednisone, naproxen for pain, and order x-ray for evaluation.   Sleep Apnea  -  Patient's STOP-BANG score of 6 revealed high risk for OSA. Will refer for sleep study and evaluate need for treatment after.   HTN - Patient's BP was elevated 153/102 and 145/100 on recheck.  Possibly could be due to sleep apnea. Will re-evaluate need to initiate medication after sleep study and need for treatment of sleep apnea to see if HTN resolves with treatment.   Meds ordered this encounter  Medications  . predniSONE (STERAPRED UNI-PAK 48 TAB) 10 MG (48) TBPK tablet    Sig: Take by mouth daily. 12-Day taper, po    Dispense:  48 tablet    Refill:  0  . naproxen (NAPROSYN) 500 MG tablet    Sig: Take 1 tablet (500 mg total) by mouth 2 (two) times daily with a meal. Take every day for one week, then use as needed after that    Dispense:  60 tablet    Refill:  1     Orders Placed This Encounter  Procedures  . DG Ankle Complete Right  . Home sleep test    Patient Instructions  Plan:  Sleep apnea: You definitely have risk factors and symptoms for this, this may also be contributing to the elevated blood pressure.  We will get you set up with a home sleep study.  Please let us know if you do not hear back in the next couple of business days about scheduling this.  I have the sleep apnea test report, we will let you know and we will go from there.  Ankle pain: Seems most likely inflammation of tendon.  Will try switching to different anti-inflammatory and will try burst of steroids for pain. Xray today. Follow-up with Dr. Karie Schwalbe in 2 weeks if no better, sooner if worse/change!         Visit summary with medication list and pertinent instructions  was printed for patient to review. All questions at time of visit were answered - patient instructed to contact office with any additional concerns or updates. ER/RTC precautions were reviewed with the patient.   Follow-up plan: Return for RECHECK PENDING RESULTS / IF WORSE OR CHANGE.  Note: Total time spent 25 minutes, greater than 50% of the visit was spent face-to-face counseling and coordinating care for the following: The primary encounter diagnosis was Acute right ankle pain. Diagnoses of Anterior tibialis tendinitis of right lower extremity, Risk factors for obstructive sleep apnea, Elevated blood pressure reading, Fatigue, unspecified type, Snoring, and BMI 40.0-44.9, adult (HCC) were also pertinent to this visit.Marland Kitchen.  Please note: voice recognition software was used to produce this document, and typos may escape review. Please contact Dr. Lyn HollingsheadAlexander for any needed clarifications.

## 2019-09-25 NOTE — Patient Instructions (Addendum)
Plan:  Sleep apnea: You definitely have risk factors and symptoms for this, this may also be contributing to the elevated blood pressure.  We will get you set up with a home sleep study.  Please let us know if you do not hear back in the next couple of business days about scheduling this.  I have the sleep apnea test report, we will let you know and we will go from there.  Ankle pain: Seems most likely inflammation of tendon.  Will try switching to different anti-inflammatory and will try burst of steroids for pain. Xray today. Follow-up with Dr. Darene Lamer in 2 weeks if no better, sooner if worse/change!

## 2019-09-29 ENCOUNTER — Encounter: Payer: Self-pay | Admitting: Osteopathic Medicine

## 2019-10-02 ENCOUNTER — Encounter: Payer: Self-pay | Admitting: Osteopathic Medicine

## 2019-10-02 NOTE — Telephone Encounter (Signed)
Did not receive order I am sending today - CF

## 2020-06-08 ENCOUNTER — Ambulatory Visit (INDEPENDENT_AMBULATORY_CARE_PROVIDER_SITE_OTHER): Payer: 59 | Admitting: Osteopathic Medicine

## 2020-06-08 ENCOUNTER — Encounter: Payer: Self-pay | Admitting: Osteopathic Medicine

## 2020-06-08 ENCOUNTER — Other Ambulatory Visit: Payer: Self-pay

## 2020-06-08 VITALS — BP 144/89 | HR 89 | Temp 98.4°F | Wt 323.0 lb

## 2020-06-08 DIAGNOSIS — R0683 Snoring: Secondary | ICD-10-CM

## 2020-06-08 DIAGNOSIS — R400 Somnolence: Secondary | ICD-10-CM

## 2020-06-08 DIAGNOSIS — I10 Essential (primary) hypertension: Secondary | ICD-10-CM

## 2020-06-08 DIAGNOSIS — M545 Low back pain, unspecified: Secondary | ICD-10-CM

## 2020-06-08 DIAGNOSIS — G8929 Other chronic pain: Secondary | ICD-10-CM

## 2020-06-08 DIAGNOSIS — R0981 Nasal congestion: Secondary | ICD-10-CM

## 2020-06-08 DIAGNOSIS — R635 Abnormal weight gain: Secondary | ICD-10-CM

## 2020-06-08 MED ORDER — MONTELUKAST SODIUM 10 MG PO TABS: 10.0000 mg | ORAL_TABLET | Freq: Every day | ORAL | 3 refills | Status: DC

## 2020-06-08 MED ORDER — LOSARTAN POTASSIUM 50 MG PO TABS
50.0000 mg | ORAL_TABLET | Freq: Every day | ORAL | 3 refills | Status: DC
Start: 2020-06-08 — End: 2021-08-16

## 2020-06-08 MED ORDER — CYCLOBENZAPRINE HCL 10 MG PO TABS: 5.0000 mg | ORAL_TABLET | Freq: Three times a day (TID) | ORAL | 1 refills | Status: DC | PRN

## 2020-06-08 MED ORDER — FLUTICASONE PROPIONATE 50 MCG/ACT NA SUSP: 2.0000 | Freq: Every day | NASAL | 6 refills | Status: DC

## 2020-06-08 NOTE — Progress Notes (Signed)
Blake Mcdaniel is a 34 y.o. male who presents to  [place] at [facility]  today, 06/08/20, seeking care for the following: . Follow up BP  . Nasal congestion . Back pain - hurts to lift/twist, no LE numbness/weakness, ongoing since high school . Weight gain since quitting smoking  . Needs sleep study, finances are a barrier      ASSESSMENT & PLAN with other pertinent history/findings:  The primary encounter diagnosis was Nasal congestion. Diagnoses of Snoring, Daytime somnolence, Morbid obesity ([place]), Essential hypertension, Weight gain, and Chronic bilateral low back pain without sciatica were also pertinent to this visit.  BP Readings from Last 3 Encounters:  06/08/20 (!) 145/90, same on recheck 144/89  09/25/19 (!) 153/102  01/17/18 128/87   CV: RRR, normal S1S2 CTABL   Patient Instructions  Congestion: Can take the Claritin twice daily for 2 weeks Can use the Flonase nasal spray twice daily for 2 weeks then once daily Can start the Singulair/montelukast every evening   Blood pressure: Labs today Medication - Losartan 50 mg daily in evening  Will recheck in 2 weeks w/ nurse visit   Back: Sent Rx for cyclobenzaprine muscle relaxer        Orders Placed This Encounter  Procedures  . CBC  . COMPLETE METABOLIC PANEL WITH GFR  . Lipid panel  . TSH  . Ambulatory referral to Sleep Studies    Meds ordered this encounter  Medications  . montelukast (SINGULAIR) 10 MG tablet    Sig: Take 1 tablet (10 mg total) by mouth at bedtime.    Dispense:  30 tablet    Refill:  3  . fluticasone (FLONASE) 50 MCG/ACT nasal spray    Sig: Place 2 sprays into both nostrils daily.    Dispense:  16 g    Refill:  6  . cyclobenzaprine (FLEXERIL) 10 MG tablet    Sig: Take 0.5-1 tablets (5-10 mg total) by mouth 3 (three) times daily as needed for muscle spasms. Caution: can cause drowsiness    Dispense:  90 tablet    Refill:  1  .  losartan (COZAAR) 50 MG tablet    Sig: Take 1 tablet (50 mg total) by mouth daily.    Dispense:  90 tablet    Refill:  3       Follow-up instructions: Return in about 2 weeks (around 06/22/2020) for recheck BP w/ nurse visit .                                         BP (!) 144/89 (BP Location: Left Arm, Patient Position: Sitting, Cuff Size: Large)   Pulse 89   Temp 98.4 F (36.9 C) (Oral)   Wt (!) 323 lb (146.5 kg)   BMI 50.59 kg/m   No outpatient medications have been marked as taking for the 06/08/20 encounter (Office Visit) with Emeterio Reeve, DO.    No results found for this or any previous visit (from the past 72 hour(s)).  No results found.  Depression screen [facility] 2/9 09/25/2019 09/04/2017  Decreased Interest 0 0  Down, Depressed, Hopeless 1 0  PHQ - 2 Score 1 0  Altered sleeping 3 -  Tired, decreased energy 3 -  Change in appetite 0 -  Feeling bad or failure about yourself  1 -  Trouble concentrating 0 -  Moving slowly  or fidgety/restless 0 -  Suicidal thoughts 0 -  PHQ-9 Score 8 -  Difficult doing work/chores Somewhat difficult -    GAD 7 : Generalized Anxiety Score 09/25/2019  Nervous, Anxious, on Edge 0  Control/stop worrying 0  Worry too much - different things 0  Trouble relaxing 0  Restless 0  Easily annoyed or irritable 0  Afraid - awful might happen 0  Total GAD 7 Score 0  Anxiety Difficulty Not difficult at all      All questions at time of visit were answered - patient instructed to contact office with any additional concerns or updates.  ER/RTC precautions were reviewed with the patient.  Please note: voice recognition software was used to produce this document, and typos may escape review. Please contact Dr. Lyn Hollingshead for any needed clarifications.

## 2020-06-08 NOTE — Patient Instructions (Addendum)
Congestion: Can take the Claritin twice daily for 2 weeks Can use the Flonase nasal spray twice daily for 2 weeks then once daily Can start the Singulair/montelukast every evening   Blood pressure: Labs today Medication - Losartan 50 mg daily in evening  Will recheck in 2 weeks w/ nurse visit   Back: Sent Rx for cyclobenzaprine muscle relaxer

## 2020-06-14 ENCOUNTER — Encounter: Payer: Self-pay | Admitting: Osteopathic Medicine

## 2020-06-15 LAB — COMPLETE METABOLIC PANEL WITH GFR
AG Ratio: 1.7 (calc) (ref 1.0–2.5)
ALT: 42 U/L (ref 9–46)
AST: 25 U/L (ref 10–40)
Albumin: 4.3 g/dL (ref 3.6–5.1)
Alkaline phosphatase (APISO): 65 U/L (ref 36–130)
BUN: 12 mg/dL (ref 7–25)
CO2: 31 mmol/L (ref 20–32)
Calcium: 9.6 mg/dL (ref 8.6–10.3)
Chloride: 102 mmol/L (ref 98–110)
Creat: 1.02 mg/dL (ref 0.60–1.35)
GFR, Est African American: 110 mL/min/{1.73_m2} (ref >=60)
GFR, Est Non African American: 95 mL/min/{1.73_m2} (ref >=60)
Globulin: 2.5 g/dL (calc) (ref 1.9–3.7)
Glucose, Bld: 87 mg/dL (ref 65–99)
Potassium: 4.3 mmol/L (ref 3.5–5.3)
Sodium: 140 mmol/L (ref 135–146)
Total Bilirubin: 0.7 mg/dL (ref 0.2–1.2)
Total Protein: 6.8 g/dL (ref 6.1–8.1)

## 2020-06-15 LAB — LIPID PANEL
Cholesterol: 181 mg/dL (ref <200)
HDL: 36 mg/dL — ABNORMAL LOW (ref >=40)
LDL Cholesterol (Calc): 120 mg/dL (calc) — ABNORMAL HIGH
Non-HDL Cholesterol (Calc): 145 mg/dL (calc) — ABNORMAL HIGH (ref <130)
Total CHOL/HDL Ratio: 5 (calc) — ABNORMAL HIGH (ref <5.0)
Triglycerides: 135 mg/dL (ref <150)

## 2020-06-15 LAB — CBC
HCT: 47.3 % (ref 38.5–50.0)
Hemoglobin: 16.1 g/dL (ref 13.2–17.1)
MCH: 31.4 pg (ref 27.0–33.0)
MCHC: 34 g/dL (ref 32.0–36.0)
MCV: 92.4 fL (ref 80.0–100.0)
MPV: 13.2 fL — ABNORMAL HIGH (ref 7.5–12.5)
Platelets: 211 10*3/uL (ref 140–400)
RBC: 5.12 10*6/uL (ref 4.20–5.80)
RDW: 12.5 % (ref 11.0–15.0)
WBC: 9 10*3/uL (ref 3.8–10.8)

## 2020-06-15 LAB — T4, FREE: Free T4: 1.2 ng/dL (ref 0.8–1.8)

## 2020-06-15 LAB — TSH: TSH: 6.01 mIU/L — ABNORMAL HIGH (ref 0.40–4.50)

## 2020-06-22 ENCOUNTER — Other Ambulatory Visit: Payer: Self-pay

## 2020-06-22 ENCOUNTER — Ambulatory Visit (INDEPENDENT_AMBULATORY_CARE_PROVIDER_SITE_OTHER): Payer: 59 | Admitting: Osteopathic Medicine

## 2020-06-22 VITALS — BP 137/83 | HR 108 | Temp 98.6°F | Wt 315.0 lb

## 2020-06-22 DIAGNOSIS — I10 Essential (primary) hypertension: Secondary | ICD-10-CM

## 2020-06-22 NOTE — Progress Notes (Signed)
Pt is here for a blood pressure check. Denies trouble sleeping, palpitations, chest pain, lightheadedness, SOB or medication changes. Pt blood pressure reading was in range. Per provider, pt is to continue with current regimen. Pt advised to schedule an appt in 3 mths with provider for a blood pressure check.

## 2020-09-07 ENCOUNTER — Other Ambulatory Visit: Payer: Self-pay | Admitting: Osteopathic Medicine

## 2020-09-22 ENCOUNTER — Encounter: Payer: Self-pay | Admitting: Osteopathic Medicine

## 2020-09-22 ENCOUNTER — Other Ambulatory Visit: Payer: Self-pay

## 2020-09-22 ENCOUNTER — Ambulatory Visit (INDEPENDENT_AMBULATORY_CARE_PROVIDER_SITE_OTHER): Payer: 59 | Admitting: Osteopathic Medicine

## 2020-09-22 VITALS — BP 159/91 | HR 91 | Wt 308.0 lb

## 2020-09-22 DIAGNOSIS — I10 Essential (primary) hypertension: Secondary | ICD-10-CM

## 2020-09-22 MED ORDER — AMBULATORY NON FORMULARY MEDICATION: 1 refills | Status: DC

## 2020-09-22 NOTE — Progress Notes (Signed)
Blake Mcdaniel is a 34 y.o. male who presents to  [facility] at [facility]  today, 09/22/20, seeking care for the following:  . BP follow-up - was WNL last visit but increased now, reports recent high salt meal. Not checing BP at home   BP Readings from Last 3 Encounters:  09/22/20 (!) 146/101, 159/91  06/22/20 137/83  06/08/20 (!) 144/89      ASSESSMENT & PLAN with other pertinent findings:  The encounter diagnosis was Essential hypertension.   No results found for this or any previous visit (from the past 24 hour(s)).  Pt to monitor BP at home Goal discussed  He'd prefer not to increase/change Rx but will if we have to!   There are no Patient Instructions on file for this visit.  No orders of the defined types were placed in this encounter.   Meds ordered this encounter  Medications  . AMBULATORY NON FORMULARY MEDICATION    Sig: Blood pressure monitor, around arm NOT around wrist, per patient preference and per insurance coverage    Dispense:  1 Units    Refill:  1       Follow-up instructions: Return for MyChart reminder set to message [place] in 1 week w/ BP numbers .                                         BP (!) 159/91 (BP Location: Left Arm, Patient Position: Sitting)   Pulse 91   Wt (!) 308 lb (139.7 kg)   SpO2 96%   BMI 48.24 kg/m   No outpatient medications have been marked as taking for the 09/22/20 encounter (Office Visit) with Sunnie Nielsen, DO.    No results found for this or any previous visit (from the past 72 hour(s)).  No results found.     All questions at time of visit were answered - patient instructed to contact office with any additional concerns or updates.  ER/RTC precautions were reviewed with the patient as applicable.   Please note: voice recognition software was used to produce this document, and typos may escape review. Please contact Dr.  Lyn Hollingshead for any needed clarification

## 2020-10-14 ENCOUNTER — Other Ambulatory Visit: Payer: Self-pay | Admitting: Osteopathic Medicine

## 2021-01-11 ENCOUNTER — Encounter: Payer: Self-pay | Admitting: Osteopathic Medicine

## 2021-01-11 DIAGNOSIS — I10 Essential (primary) hypertension: Secondary | ICD-10-CM

## 2021-01-11 DIAGNOSIS — R0683 Snoring: Secondary | ICD-10-CM

## 2021-01-11 DIAGNOSIS — R400 Somnolence: Secondary | ICD-10-CM | POA: Insufficient documentation

## 2021-01-11 NOTE — Telephone Encounter (Signed)
Sleep study referral re-order.

## 2021-02-14 ENCOUNTER — Encounter: Payer: Self-pay | Admitting: Neurology

## 2021-02-14 ENCOUNTER — Ambulatory Visit: Payer: 59 | Admitting: Neurology

## 2021-02-14 VITALS — BP 146/92 | HR 88 | Ht 68.0 in | Wt 316.0 lb

## 2021-02-14 DIAGNOSIS — Z9189 Other specified personal risk factors, not elsewhere classified: Secondary | ICD-10-CM

## 2021-02-14 DIAGNOSIS — G4710 Hypersomnia, unspecified: Secondary | ICD-10-CM

## 2021-02-14 DIAGNOSIS — R6889 Other general symptoms and signs: Secondary | ICD-10-CM

## 2021-02-14 DIAGNOSIS — G4730 Sleep apnea, unspecified: Secondary | ICD-10-CM

## 2021-02-14 DIAGNOSIS — R0683 Snoring: Secondary | ICD-10-CM

## 2021-02-14 DIAGNOSIS — G4719 Other hypersomnia: Secondary | ICD-10-CM | POA: Diagnosis not present

## 2021-02-14 NOTE — Patient Instructions (Signed)
Sleep Apnea Sleep apnea affects breathing during sleep. It causes breathing to stop for a short time or to become shallow. It can also increase the risk of:  Heart attack.  Stroke.  Being very overweight (obese).  Diabetes.  Heart failure.  Irregular heartbeat. The goal of treatment is to help you breathe normally again. What are the causes? There are three kinds of sleep apnea:  Obstructive sleep apnea. This is caused by a blocked or collapsed airway.  Central sleep apnea. This happens when the brain does not send the right signals to the muscles that control breathing.  Mixed sleep apnea. This is a combination of obstructive and central sleep apnea. The most common cause of this condition is a collapsed or blocked airway. This can happen if:  Your throat muscles are too relaxed.  Your tongue and tonsils are too large.  You are overweight.  Your airway is too small.   What increases the risk?  Being overweight.  Smoking.  Having a small airway.  Being older.  Being male.  Drinking alcohol.  Taking medicines to calm yourself (sedatives or tranquilizers).  Having family members with the condition. What are the signs or symptoms?  Trouble staying asleep.  Being sleepy or tired during the day.  Getting angry a lot.  Loud snoring.  Headaches in the morning.  Not being able to focus your mind (concentrate).  Forgetting things.  Less interest in sex.  Mood swings.  Personality changes.  Feelings of sadness (depression).  Waking up a lot during the night to pee (urinate).  Dry mouth.  Sore throat. How is this diagnosed?  Your medical history.  A physical exam.  A test that is done when you are sleeping (sleep study). The test is most often done in a sleep lab but may also be done at home. How is this treated?  Sleeping on your side.  Using a medicine to get rid of mucus in your nose (decongestant).  Avoiding the use of alcohol,  medicines to help you relax, or certain pain medicines (narcotics).  Losing weight, if needed.  Changing your diet.  Not smoking.  Using a machine to open your airway while you sleep, such as: ? An oral appliance. This is a mouthpiece that shifts your lower jaw forward. ? A CPAP device. This device blows air through a mask when you breathe out (exhale). ? An EPAP device. This has valves that you put in each nostril. ? A BPAP device. This device blows air through a mask when you breathe in (inhale) and breathe out.  Having surgery if other treatments do not work. It is important to get treatment for sleep apnea. Without treatment, it can lead to:  High blood pressure.  Coronary artery disease.  In men, not being able to have an erection (impotence).  Reduced thinking ability.   Follow these instructions at home: Lifestyle  Make changes that your doctor recommends.  Eat a healthy diet.  Lose weight if needed.  Avoid alcohol, medicines to help you relax, and some pain medicines.  Do not use any products that contain nicotine or tobacco, such as cigarettes, e-cigarettes, and chewing tobacco. If you need help quitting, ask your doctor. General instructions  Take over-the-counter and prescription medicines only as told by your doctor.  If you were given a machine to use while you sleep, use it only as told by your doctor.  If you are having surgery, make sure to tell your doctor you have   sleep apnea. You may need to bring your device with you.  Keep all follow-up visits as told by your doctor. This is important. Contact a doctor if:  The machine that you were given to use during sleep bothers you or does not seem to be working.  You do not get better.  You get worse. Get help right away if:  Your chest hurts.  You have trouble breathing in enough air.  You have an uncomfortable feeling in your back, arms, or stomach.  You have trouble talking.  One side of your  body feels weak.  A part of your face is hanging down. These symptoms may be an emergency. Do not wait to see if the symptoms will go away. Get medical help right away. Call your local emergency services (911 in the [place] Do not drive yourself to the hospital. Summary  This condition affects breathing during sleep.  The most common cause is a collapsed or blocked airway.  The goal of treatment is to help you breathe normally while you sleep. This information is not intended to replace advice given to you by your health care provider. Make sure you discuss any questions you have with your health care provider. Document Revised: 09/20/2018 Document Reviewed: 07/30/2018 Elsevier Patient Education  2021 Elsevier Inc. [email or web address]">  Obesity-Hypoventilation Syndrome Obesity-hypoventilation syndrome (OHS) is a condition in which a person cannot efficiently move air in and out of the lungs (ventilate). This condition causes hypoventilation, which means the blood will have a buildup of carbon dioxideand a drop in oxygen levels. Key factors for OHS include having too much body fat, or obesity, and having high levels of awake daytime carbon dioxide (hypercapnia). OHS can increase the risk for:  Cor pulmonale, or right-sided heart failure.  Congestive heart failure.  Pulmonary hypertension, or high blood pressure in the arteries in the lungs.  Too many red blood cells in the body.  Disability or death. What are the causes? This condition may be caused by:  Being obese with a BMI (body mass index) greater than or equal to 30 kg/m2.  Having too much fat around the abdomen, chest, and neck.  The brain being unable to properly manage the high carbon dioxide and low oxygen levels.  Hormones made by fat cells. These hormones may interfere with breathing.  Sleep apnea. This is when breathing stops, pauses, or  is shallow during sleep. What are the signs or symptoms? Symptoms of this condition include:  Feeling sleepy during the day.  Headaches. These may be worse in the morning.  Shortness of breath.  Snoring, choking, gasping, or trouble breathing during sleep.  Poor concentration or poor memory.  Mood changes or feeling irritable.  Depression. How is this diagnosed? This condition may be diagnosed by:  BMI measurement.  Blood tests to measure blood levels of serum bicarbonate, carbon dioxide, and oxygen.  Pulse oximetry to measure the amount of oxygen in your blood. This uses a small device that is placed on your finger, earlobe, or toe.  Polysomnogram, or sleep study, to check your breathing patterns and levels of oxygen and carbon dioxide while you sleep. You may also have other tests, including:  A chest X-ray to rule out other breathing problems.  Lung tests, or pulmonary function tests, to rule out other breathing problems.  ECG (electrocardiogram) or echocardiogram to check for signs of heart failure. How is this treated? This condition may be treated with:  A device such as a continuous positive airway  pressure (CPAP) machine or a bi-level positive airway pressure (BPAP) machine. These devices deliver pressure and sometimes oxygen to make breathing easier. A mask may be placed over your nose or mouth.  Oxygen if your blood oxygen levels are low.  A weight-loss program.  Bariatric, or weight-loss, surgery.  Tracheostomy. A tube is placed in the windpipe through the neck to help with breathing.   Follow these instructions at home: Medicines  Take over-the-counter and prescription medicines only as told by your health care provider.  Ask your health care provider what medicines are safe for you. You may be told to avoid medicines such as sedatives and narcotics. These can affect breathing and make OHS worse. Sleeping habits  If you are prescribed a CPAP or a BPAP  machine, make sure you understand how to use it. Use your CPAP or BPAP machine only as told by your health care provider.  Try to get at least 8 hours of sleep every night. Eating and drinking  Eat foods that are high in fiber, such as beans, whole grains, and fresh fruits and vegetables.  Limit foods that are high in fat and processed sugars, such as fried or sweet foods.  Drink enough fluid to keep your urine pale yellow.  Do not drink alcohol if: ? Your health care provider tells you not to drink. ? You are pregnant, may be pregnant, or are planning to become pregnant.   General instructions  Follow a diet and exercise plan that helps you reach and keep a healthy weight as told by your health care provider.  Exercise regularly as told by your health care provider.  Do not use any products that contain nicotine or tobacco, such as cigarettes, e-cigarettes, and chewing tobacco. If you need help quitting, ask your health care provider.  Keep all follow-up visits as told by your health care provider. This is important.   Contact a health care provider if:  You develop new or worsening shortness of breath.  You are having trouble waking up or staying awake.  You are confused.  You have chest pain.  You have fast or irregular heartbeats.  You are dizzy or you faint.  You develop a cough.  You have a fever. Get help right away if: You have any symptoms of a stroke. "BE FAST" is an easy way to remember the main warning signs of a stroke:  B - Balance. Signs are dizziness, sudden trouble walking, or loss of balance.  E - Eyes. Signs are trouble seeing or a sudden change in vision.  F - Face. Signs are sudden weakness or numbness of the face, or the face or eyelid drooping on one side.  A - Arms. Signs are weakness or numbness in an arm. This happens suddenly and usually on one side of the body.  S - Speech. Signs are sudden trouble speaking, slurred speech, or trouble  understanding what people say.  T - Time. Time to call emergency services. Write down what time symptoms started. You have other signs of a stroke, such as:  A sudden, severe headache with no known cause.  Nausea or vomiting.  Seizure. These symptoms may represent a serious problem that is an emergency. Do not wait to see if the symptoms will go away. Get medical help right away. Call your local emergency services (911 in the [place] Do not drive yourself to the hospital. If you ever feel like you may hurt yourself or others, or have thoughts about  taking your own life, get help right away. Go to your nearest emergency department or:  Call your local emergency services (911 in the [place]  Call a suicide crisis helpline, such as the National Suicide Prevention Lifeline at (715)776-0162. This is open 24 hours a day in the [place]  Text the Crisis Text Line at (380)419-6699 (in the [place] Summary  Obesity-hypoventilation syndrome (OHS) causes hypoventilation, which means the blood will have a buildup of carbon dioxideand a drop in oxygen levels.  Key factors for OHS include having too much body fat, or obesity, and having high levels of awake daytime carbon dioxide (hypercapnia).  OHS can increase the risk for heart failure, pulmonary hypertension, disability, and death.  Follow your diet and exercise plan as told by your health care provider. This information is not intended to replace advice given to you by your health care provider. Make sure you discuss any questions you have with your health care provider. Document Revised: 11/28/2019 Document Reviewed: 11/28/2019 Elsevier Patient Education  2021 [employer].

## 2021-02-14 NOTE — Progress Notes (Signed)
SLEEP MEDICINE CLINIC    Provider:  Melvyn Novas, MD  Primary Care Physician:  Sunnie Nielsen, DO [address] [facility] [address] [place] [address]     Referring Provider: Lorenza Chick [address] [place],  [place] [address]          Chief Complaint according to patient   Patient presents with:    . New Patient (Initial Visit)           HISTORY OF PRESENT ILLNESS:    Blake Mcdaniel is a 35 y.o. Caucasian male patient was seen here upon referral on 02/14/2021 from PCP for an evaluation of sleep apnea, suspected to have OSA for about 4 years,  Loud snoring and witnessed apneas. Weight gain. He wakes up choking and with a very dry parched mouth. He has often a congested nose. He takes anti-allergy medication and quit smoking, has still occassionally sinusitis.  Chief concern according to patient :  Pcp referred for SS. He was suppose to have this assessed some time ago but insurance/cost of study was more than could afford at time. Here to restart the process and complete the work up.   Blake Mcdaniel has a past medical history of Left knee pain (08/10/2016), Morbid obesity (HCC) (08/10/2016), and Snoring (08/10/2016). He is not vaccinated against COVID 19.    Sleep relevant medical history: allergic rhinitis,  Nocturia once at night- not waking him, no deviated septum.  Family medical /sleep history: Father on CPAP with OSA.   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  They have a 37 year old boy.  Tobacco use: quit 12 month ago 01-2020. ETOH use ; rare- last drink was 07-2020 ,  Caffeine intake in form of Coffee( /) Soda( 1 a day) Tea ( when eating out ) and has a morning time  energy drinks. 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 The patient goes to bed at 9 PM and continues to  sleep for 3-4 hours, wakes for one bathroom break some nights The preferred sleep position is supine , with the support of 1-2 pillows.  Dreams are reportedly frequent/vivid- can have dreams early in the night and lucid  Dreams," can dream in installments" . Has had isolated sleep paralysis.   5.30 AM is the usual rise time. The patient wakes up at 3-5.30 with an alarm. On weekends he likes ot sleep longer but can get headaches.  He reports not feeling refreshed or restored in AM, with symptoms such as dry mouth , morning headaches, groggy, and residual fatigue.  Naps are avoided, but he can fall asleep easily- , lasting from 15 minutes and are more refreshing than nocturnal sleep.    Review of Systems: Out of a complete 14 system review, the patient complains of only the following symptoms, and all other reviewed systems are negative.:  Fatigue, sleepiness , snoring, fragmented sleep, vivid , lucid dreams, sleep attacks, sleep paralysis.  Shift work- swing shift hours.    How likely are you to doze in the following situations: 0 = not likely, 1 = slight chance, 2 = moderate chance, 3 = high chance   Sitting and Reading? Watching Television? Sitting inactive in a public place (theater or meeting)? As a passenger in a car for  an hour without a break? Lying down in the afternoon when circumstances permit? Sitting and talking to someone? Sitting quietly after lunch without alcohol? In a car, while stopped for a few minutes in traffic?   Total = 14/ 24 points   FSS endorsed at 37/ 63 points.   Blake Mcdaniel reports that he can easily fall asleep when he is not stimulated and at rest.  His current BMI is 48 which is also high risk for sleep apnea his blood pressure was a little bit elevated here today but has not been in primary care settings before.  Social History   Socioeconomic History  . Marital status: Single    Spouse name: Not on file  . Number of children: Not on file  . Years  of education: Not on file  . Highest education level: Not on file  Occupational History  . Not on file  Tobacco Use  . Smoking status: Current Every Day Smoker    Packs/day: 0.50    Types: Pipe  . Smokeless tobacco: Never Used  Substance and Sexual Activity  . Alcohol use: Not on file  . Drug use: Not on file  . Sexual activity: Yes    Partners: Female    Birth control/protection: Condom  Other Topics Concern  . Not on file  Social History Narrative  . Not on file   Social Determinants of Health   Financial Resource Strain: Not on file  Food Insecurity: Not on file  Transportation Needs: Not on file  Physical Activity: Not on file  Stress: Not on file  Social Connections: Not on file    Family History  Problem Relation Age of Onset  . Heart attack Maternal Uncle   . Diabetes Maternal Grandfather     Past Medical History:  Diagnosis Date  . Left knee pain 08/10/2016  . Morbid obesity (HCC) 08/10/2016  . Snoring 08/10/2016    No past surgical history on file.   Current Outpatient Medications on File Prior to Visit  Medication Sig Dispense Refill  . cyclobenzaprine (FLEXERIL) 10 MG tablet TAKE 1/2 TO 1 TABLET 3 TIMES A DAY AS NEEDED MUSCLE SPASMS 90 tablet 1  . fluticasone (FLONASE) 50 MCG/ACT nasal spray Place 2 sprays into both nostrils daily. 16 g 6  . losartan (COZAAR) 50 MG tablet Take 1 tablet (50 mg total) by mouth daily. 90 tablet 3  . montelukast (SINGULAIR) 10 MG tablet TAKE 1 TABLET BY MOUTH EVERYDAY AT BEDTIME 90 tablet 1   No current facility-administered medications on file prior to visit.   Physical exam:  Today's Vitals   02/14/21 1540  BP: (!) 146/92  Pulse: 88  Weight: (!) 316 lb (143.3 kg)  Height: 5\' 8"  (1.727 m)   Body mass index is 48.05 kg/m.   Wt Readings from Last 3 Encounters:  02/14/21 (!) 316 lb (143.3 kg)  09/22/20 (!) 308 lb (139.7 kg)  06/22/20 (!) 315 lb 0.6 oz (142.9 kg)     Ht Readings from Last 3 Encounters:   02/14/21 5\' 8"  (1.727 m)  09/04/17 5\' 7"  (1.702 m)  08/10/16 5\' 7"  (1.702 m)      General: The patient is awake, alert and appears not in acute distress. Facial hair.  Head: Normocephalic, atraumatic. Neck is supple. Mallampati 3 plus   neck circumference: 19 inches . Nasal airflow not fully patent.  Retrognathia is not  seen.  Dental status: intact.  Cardiovascular:  Regular rate and cardiac rhythm by pulse,  without distended neck veins. Respiratory: Lungs are clear to auscultation.  Skin:  With evidence of ankle edema,no rash.  Trunk: The patient's posture is erect. Pyknic built.    Neurologic exam : The patient is awake and alert, oriented to place and time.   Memory subjective described as intact.  Attention span & concentration ability appears normal.  Speech is fluent,  without  dysarthria, dysphonia or aphasia.  Mood and affect are appropriate.   Cranial nerves: no loss of smell or taste reported  Pupils are equal and briskly reactive to light. Funduscopic exam deferred.   Extraocular movements in vertical and horizontal planes were intact and without nystagmus. No Diplopia. Visual fields by finger perimetry are intact. Hearing was intact to soft voice and finger rubbing.    Facial sensation intact to fine touch.  Facial motor strength is symmetric and tongue and uvula move midline.  Neck ROM : rotation, tilt and flexion extension were normal for age and shoulder shrug was symmetrical.    Motor exam:  Symmetric bulk, tone and ROM.   Normal tone without cog wheeling, symmetric grip strength .   Sensory:  Fine touch and vibration were normal.  Proprioception tested in the upper extremities was normal.   Coordination: Rapid alternating movements in the fingers/hands were of normal speed.  The Finger-to-nose maneuver was intact without evidence of ataxia, dysmetria or tremor.   Gait and station: Patient could rise unassisted from a seated position, walked without assistive  device.  Stance is of  wider base and the patient turned with 3 steps.  Toe and heel walk were deferred.  Deep tendon reflexes: in the  upper and lower extremities are symmetric and intact.  Babinski response was deferred.      After spending a total time of  35  minutes face to face and additional time for physical and neurologic examination, review of laboratory studies,  personal review of imaging studies, reports and results of other testing and review of referral information / records as far as provided in visit, I have established the following assessments:  1)  EDS- high risk of OSA, Mallampati, neck size, BMI.   2)  Vivid dreams and sleep paralysis.   3)  Obesity    My Plan is to proceed with:  1) HST to confirm apnea and initiate treatment.  2) weight loss guidance 3) sleep hours to be more consistent.   I would like to thank  Sunnie Nielsen, Do [address] [place],  [place] [address] for allowing me to meet with and to take care of this pleasant patient.   I plan to follow up either personally or through our NP within 3 month.     Electronically signed by: Melvyn Novas, MD 02/14/2021 3:56 PM  [facility] and [facility] Sleep Board certified by [employer] of Sleep Medicine and Diplomate of the [employer] of Sleep Medicine. Board certified In Neurology through the ABPN, Fellow of the [employer] of Neurology. Medical Director of [facility].

## 2021-03-05 ENCOUNTER — Other Ambulatory Visit: Payer: Self-pay | Admitting: Osteopathic Medicine

## 2021-03-08 ENCOUNTER — Ambulatory Visit (INDEPENDENT_AMBULATORY_CARE_PROVIDER_SITE_OTHER): Payer: 59 | Admitting: Neurology

## 2021-03-08 DIAGNOSIS — G4733 Obstructive sleep apnea (adult) (pediatric): Secondary | ICD-10-CM | POA: Diagnosis not present

## 2021-03-08 DIAGNOSIS — Z9189 Other specified personal risk factors, not elsewhere classified: Secondary | ICD-10-CM

## 2021-03-08 DIAGNOSIS — R400 Somnolence: Secondary | ICD-10-CM

## 2021-03-08 DIAGNOSIS — G4734 Idiopathic sleep related nonobstructive alveolar hypoventilation: Secondary | ICD-10-CM

## 2021-03-08 DIAGNOSIS — R6889 Other general symptoms and signs: Secondary | ICD-10-CM

## 2021-03-10 NOTE — Progress Notes (Signed)
   [facility]  HOME SLEEP TEST (Watch PAT)  STUDY DATE: 03-10-21  DOB: [birth year]  MRN: [id number]  ORDERING CLINICIAN: Melvyn Novas, MD   REFERRING CLINICIAN: Sunnie Nielsen, DO   CLINICAL INFORMATION/HISTORY: Blake Mcdaniel a 35 y.o. Caucasian male patientand was seen upon referralon 02/14/2021 for an evaluation of sleep apnea, suspected to have OSA for about 4 years, based on loud snoring and witnessed apneas/ Weight gain./He wakes up choking and with a very dry, parched mouth. He has often a congested nose, takes anti-allergy medication and quit smoking, has still occassionally sinusitis.  Chiefconcernaccording to patient :  He was supposed to have OSA assessed some time ago but insurance/cost of study was more than could afford at the time. Here to restart the process and complete the work up.  [name] has a past medical history of Left knee pain (08/10/2016), Morbid obesity (HCC) (08/10/2016), and Snoring (08/10/2016). reported vivid dreams and  isolated sleep paralysis as well.   Epworth sleepiness score: 14/24.  BMI: 47.8 kg/m  Neck Circumference: 19 "  FINDINGS:   Total Record Time (hours, min): 9 h 39 min  Total Sleep Time (hours, min):  8 h 42 min   Percent REM (%):    14.93 %   Calculated pAHI (per hour):  51.8      REM pAHI: 88.5    NREM pAHI: 45.6 Supine AHI: 51.6   Oxygen Saturation (%) Mean: 93  Minimum oxygen saturation (%):        58   O2 Saturation Range (%): 58-100  O2Saturation (minutes) <89%: 24.8 min   Pulse Mean (bpm):    69  Pulse Range (69-108)   IMPRESSION: This HST confirms the presence of severe OSA (obstructive sleep apnea) at an AHI of 51.8/h.  Few isolated central apneas were noted, and frequent but brief oxygen desaturations. REM sleep added to the AHI but even in NRE sleep was the AHI still 45.6/h.    RECOMMENDATION: The severity of OSA and associated hypoxia events make intervention necessary. I will  recommend starting auto CPAP and if that cannot be tolerated or control sleep apnea well, we will invite for an attended PAP titration. For now : auto CPAP 5-20 cm water with 2 cm EPR and heated humidification and mask of choice ( facial hair to be considered).    INTERPRETING PHYSICIAN:  Melvyn Novas, MD  [facility] Sleep Board certified by [employer] of Sleep Medicine . Board certified In Neurology through the ABPN, Fellow of the [employer] of Neurology. Medical Director of [facility].

## 2021-03-25 DIAGNOSIS — R6889 Other general symptoms and signs: Secondary | ICD-10-CM | POA: Insufficient documentation

## 2021-03-25 DIAGNOSIS — G4733 Obstructive sleep apnea (adult) (pediatric): Secondary | ICD-10-CM | POA: Insufficient documentation

## 2021-03-25 DIAGNOSIS — Z9189 Other specified personal risk factors, not elsewhere classified: Secondary | ICD-10-CM | POA: Insufficient documentation

## 2021-03-25 DIAGNOSIS — G4734 Idiopathic sleep related nonobstructive alveolar hypoventilation: Secondary | ICD-10-CM | POA: Insufficient documentation

## 2021-03-25 NOTE — Procedures (Signed)
[facility]  HOME SLEEP TEST (Watch PAT)  STUDY DATE: 03-10-21  DOB: [birth year]  MRN: [id number]  ORDERING CLINICIAN: Melvyn Novas, MD   REFERRING CLINICIAN: Sunnie Nielsen, DO   CLINICAL INFORMATION/HISTORY: Blake Mcdaniel a 35 y.o. Caucasian male patientand was seen upon referralon 02/14/2021 for an evaluation of sleep apnea, suspected to have OSA for about 4 years, based on loud snoring and witnessed apneas/ Weight gain./He wakes up choking and with a very dry, parched mouth. He has often a congested nose, takes anti-allergy medication and quit smoking, has still occassionally sinusitis.  Chiefconcernaccording to patient :  He was supposed to have OSA assessed some time ago but insurance/cost of study was more than could afford at the time. Here to restart the process and complete the work up.  [name] has a past medical history of Left knee pain (08/10/2016), Morbid obesity (HCC) (08/10/2016), and Snoring (08/10/2016). reported vivid dreams and  isolated sleep paralysis as well.   Epworth sleepiness score: 14/24.  BMI: 47.8 kg/m  Neck Circumference: 19 "  FINDINGS:   Total Record Time (hours, min): 9 h 39 min  Total Sleep Time (hours, min):  8 h 42 min   Percent REM (%):    14.93 %   Calculated pAHI (per hour):  51.8      REM pAHI: 88.5    NREM pAHI: 45.6 Supine AHI: 51.6   Oxygen Saturation (%) Mean: 93  Minimum oxygen saturation (%):        58   O2 Saturation Range (%): 58-100  O2Saturation (minutes) <89%: 24.8 min   Pulse Mean (bpm):    69  Pulse Range (69-108)   IMPRESSION: This HST confirms the presence of severe OSA (obstructive sleep apnea) at an AHI of 51.8/h.  Few isolated central apneas were noted, and frequent but brief oxygen desaturations. REM sleep added to the AHI but even in NRE sleep was the AHI still 45.6/h.    RECOMMENDATION: The severity of OSA and associated hypoxia events make intervention necessary. Weight loss will  support a reduction in apnea but may not resolve all.   I  recommend starting auto titration CPAP device .and if this therapy cannot be tolerated or control sleep apnea well, we will invite for an attended PAP titration.  For now : auto CPAP 6-20 cm water with 2 cm EPR and heated humidification and mask of choice ( facial hair to be considered).    INTERPRETING PHYSICIAN:  Melvyn Novas, MD  [facility] Sleep Board certified by [employer] of Sleep Medicine . Board certified In Neurology through the ABPN, Fellow of the [employer] of Neurology. Medical Director of [facility].

## 2021-03-25 NOTE — Progress Notes (Signed)
IMPRESSION: This HST confirms the presence of severe OSA (obstructive sleep apnea) at an AHI of 51.8/h.  Few isolated central apneas were noted, and frequent but brief oxygen desaturations. REM sleep added to the AHI but even in NREM sleep was the AHI still 45.6/h.    RECOMMENDATION: The severity of OSA and associated hypoxia events make intervention necessary. Weight loss will support a reduction in apnea but may not resolve all.   I  recommend starting auto titration CPAP device .and if this therapy cannot be tolerated or control sleep apnea well, we will invite for an attended PAP titration.  For now : auto CPAP of a pressure range from 6-20 cm water , 2 cm EPR ,and heated humidification and mask of choice ( facial hair to be considered).

## 2021-03-25 NOTE — Addendum Note (Signed)
Addended by: Melvyn Novas on: 03/25/2021 02:40 PM   Modules accepted: Orders

## 2021-03-28 ENCOUNTER — Telehealth: Payer: Self-pay

## 2021-03-28 NOTE — Telephone Encounter (Signed)
I called pt. I advised pt that Dr. Vickey Huger reviewed their sleep study results and found that pt has severe osa. Dr. Vickey Huger recommends that pt start. I reviewed PAP compliance expectations with the pt. Pt is agreeable to starting an auto-PAP. I advised pt that an order will be sent to a DME, [facility], and [facility] will call the pt within about one week after they file with the pt's insurance. [facility] will show the pt how to use the machine, fit for masks, and troubleshoot the auto-PAP if needed. A follow up appt was made for insurance purposes with Amy, NP on 07/18/21 at 3:30pm. Pt verbalized understanding to arrive 15 minutes early and bring their auto-PAP. A letter with all of this information in it will be mailed to the pt as a reminder. I verified with the pt that the address we have on file is correct. Pt verbalized understanding of results. Pt had no questions at this time but was encouraged to call back if questions arise. I have sent the order to [facility] and have received confirmation that they have received the order.

## 2021-03-28 NOTE — Telephone Encounter (Signed)
-----   Message from Melvyn Novas, MD sent at 03/25/2021  2:39 PM EDT ----- IMPRESSION: This HST confirms the presence of severe OSA (obstructive sleep apnea) at an AHI of 51.8/h.  Few isolated central apneas were noted, and frequent but brief oxygen desaturations. REM sleep added to the AHI but even in NREM sleep was the AHI still 45.6/h.    RECOMMENDATION: The severity of OSA and associated hypoxia events make intervention necessary. Weight loss will support a reduction in apnea but may not resolve all.   I  recommend starting auto titration CPAP device .and if this therapy cannot be tolerated or control sleep apnea well, we will invite for an attended PAP titration.  For now : auto CPAP of a pressure range from 6-20 cm water , 2 cm EPR ,and heated humidification and mask of choice ( facial hair to be considered).

## 2021-04-06 ENCOUNTER — Other Ambulatory Visit: Payer: Self-pay | Admitting: Osteopathic Medicine

## 2021-04-06 MED ORDER — AMBULATORY NON FORMULARY MEDICATION: 99 refills | Status: DC

## 2021-04-07 ENCOUNTER — Other Ambulatory Visit: Payer: Self-pay | Admitting: Family Medicine

## 2021-04-07 MED ORDER — AMBULATORY NON FORMULARY MEDICATION: 99 refills | Status: AC

## 2021-04-21 ENCOUNTER — Other Ambulatory Visit: Payer: Self-pay | Admitting: Osteopathic Medicine

## 2021-04-22 ENCOUNTER — Other Ambulatory Visit: Payer: Self-pay | Admitting: Osteopathic Medicine

## 2021-05-16 ENCOUNTER — Encounter (HOSPITAL_COMMUNITY): Payer: Self-pay

## 2021-05-16 ENCOUNTER — Emergency Department (HOSPITAL_COMMUNITY)
Admission: EM | Admit: 2021-05-16 | Discharge: 2021-05-16 | Disposition: A | Discharge status: Home / Self Care | Payer: Managed Care, Other (non HMO) | Attending: Emergency Medicine | Admitting: Emergency Medicine

## 2021-05-16 ENCOUNTER — Other Ambulatory Visit: Payer: Self-pay

## 2021-05-16 ENCOUNTER — Emergency Department (HOSPITAL_COMMUNITY): Payer: Managed Care, Other (non HMO)

## 2021-05-16 DIAGNOSIS — F17200 Nicotine dependence, unspecified, uncomplicated: Secondary | ICD-10-CM | POA: Diagnosis not present

## 2021-05-16 DIAGNOSIS — R109 Unspecified abdominal pain: Secondary | ICD-10-CM | POA: Diagnosis present

## 2021-05-16 DIAGNOSIS — I10 Essential (primary) hypertension: Secondary | ICD-10-CM | POA: Insufficient documentation

## 2021-05-16 DIAGNOSIS — N201 Calculus of ureter: Secondary | ICD-10-CM

## 2021-05-16 HISTORY — DX: Essential (primary) hypertension: I10

## 2021-05-16 HISTORY — DX: Obstructive sleep apnea (adult) (pediatric): G47.33

## 2021-05-16 LAB — CBC WITH DIFFERENTIAL/PLATELET
Abs Immature Granulocytes: 0.05 10*3/uL (ref 0.00–0.07)
Basophils Absolute: 0.1 10*3/uL (ref 0.0–0.1)
Basophils Relative: 1 %
Eosinophils Absolute: 0.1 10*3/uL (ref 0.0–0.5)
Eosinophils Relative: 1 %
HCT: 48.1 % (ref 39.0–52.0)
Hemoglobin: 16.2 g/dL (ref 13.0–17.0)
Immature Granulocytes: 1 %
Lymphocytes Relative: 12 %
Lymphs Abs: 1.3 10*3/uL (ref 0.7–4.0)
MCH: 31.3 pg (ref 26.0–34.0)
MCHC: 33.7 g/dL (ref 30.0–36.0)
MCV: 92.9 fL (ref 80.0–100.0)
Monocytes Absolute: 0.7 10*3/uL (ref 0.1–1.0)
Monocytes Relative: 6 %
Neutro Abs: 8.7 10*3/uL — ABNORMAL HIGH (ref 1.7–7.7)
Neutrophils Relative %: 79 %
Platelets: 186 10*3/uL (ref 150–400)
RBC: 5.18 MIL/uL (ref 4.22–5.81)
RDW: 12.6 % (ref 11.5–15.5)
WBC: 10.9 10*3/uL — ABNORMAL HIGH (ref 4.0–10.5)
nRBC: 0 % (ref 0.0–0.2)

## 2021-05-16 LAB — BASIC METABOLIC PANEL
Anion gap: 7 (ref 5–15)
BUN: 13 mg/dL (ref 6–20)
CO2: 28 mmol/L (ref 22–32)
Calcium: 9.2 mg/dL (ref 8.9–10.3)
Chloride: 104 mmol/L (ref 98–111)
Creatinine, Ser: 1.05 mg/dL (ref 0.61–1.24)
GFR, Estimated: >60 mL/min (ref >60)
Glucose, Bld: 138 mg/dL — ABNORMAL HIGH (ref 70–99)
Potassium: 3.7 mmol/L (ref 3.5–5.1)
Sodium: 139 mmol/L (ref 135–145)

## 2021-05-16 LAB — URINALYSIS, ROUTINE W REFLEX MICROSCOPIC
Bacteria, UA: NONE SEEN
Bilirubin Urine: NEGATIVE
Glucose, UA: NEGATIVE mg/dL
Ketones, ur: NEGATIVE mg/dL
Leukocytes,Ua: NEGATIVE
Nitrite: NEGATIVE
Protein, ur: NEGATIVE mg/dL
RBC / HPF: >50 RBC/hpf — ABNORMAL HIGH (ref 0–5)
Specific Gravity, Urine: 1.018 (ref 1.005–1.030)
pH: 6 (ref 5.0–8.0)

## 2021-05-16 LAB — CBG MONITORING, ED: Glucose-Capillary: 117 mg/dL — ABNORMAL HIGH (ref 70–99)

## 2021-05-16 MED ORDER — OXYCODONE-ACETAMINOPHEN 5-325 MG PO TABS: 1.0000 | ORAL_TABLET | Freq: Four times a day (QID) | ORAL | 0 refills | Status: DC | PRN

## 2021-05-16 MED ORDER — ONDANSETRON HCL 4 MG/2ML IJ SOLN
4.0000 mg | Freq: Once | INTRAMUSCULAR | Status: AC
  Administered 2021-05-16: 4 mg via INTRAVENOUS
  Filled 2021-05-16: qty 2

## 2021-05-16 MED ORDER — SODIUM CHLORIDE 0.9 % IV BOLUS
1000.0000 mL | Freq: Once | INTRAVENOUS | Status: AC
  Administered 2021-05-16: 1000 mL via INTRAVENOUS

## 2021-05-16 MED ORDER — HYDROMORPHONE HCL 1 MG/ML IJ SOLN
1.0000 mg | Freq: Once | INTRAMUSCULAR | Status: AC
  Administered 2021-05-16: 1 mg via INTRAVENOUS
  Filled 2021-05-16: qty 1

## 2021-05-16 MED ORDER — IBUPROFEN 800 MG PO TABS: 800.0000 mg | ORAL_TABLET | Freq: Three times a day (TID) | ORAL | 0 refills | Status: DC | PRN

## 2021-05-16 MED ORDER — SENNOSIDES-DOCUSATE SODIUM 8.6-50 MG PO TABS: 1.0000 | ORAL_TABLET | Freq: Every evening | ORAL | 0 refills | Status: DC | PRN

## 2021-05-16 MED ORDER — KETOROLAC TROMETHAMINE 30 MG/ML IJ SOLN
30.0000 mg | Freq: Once | INTRAMUSCULAR | Status: AC
  Administered 2021-05-16: 30 mg via INTRAVENOUS
  Filled 2021-05-16: qty 1

## 2021-05-16 MED ORDER — TAMSULOSIN HCL 0.4 MG PO CAPS: 0.4000 mg | ORAL_CAPSULE | Freq: Every day | ORAL | 0 refills | Status: DC

## 2021-05-16 NOTE — ED Triage Notes (Signed)
[facility]ms for left flank pain. Since this morning. +painful urination.

## 2021-05-16 NOTE — ED Triage Notes (Signed)
Pt reports waking up from sleep at 0430 with pulsating left side flank pain. Pt arrived from home via [facility].

## 2021-05-16 NOTE — ED Provider Notes (Signed)
Emergency Medicine Provider Triage Evaluation Note  Blake Mcdaniel , a 35 y.o. male  was evaluated in triage.  Pt complains of left lower abdomen, flank and back pain.  Started this morning after having a bowel movement successfully.  Has some trouble with urination.  Nausea but no vomiting.  No history of kidney stones..  Review of Systems  Positive: Flank pain, nausea, dysuria Negative: Fever, hematuria, chest pain, testicle pain  Physical Exam  BP (!) 159/101 (BP Location: Right Arm)   Pulse 67   Temp 97.9 F (36.6 C) (Oral)   Resp 20   Ht 5\' 7"  (1.702 m)   Wt 136.1 kg   SpO2 100%   BMI 46.99 kg/m  Gen:   Awake, uncomfortable, diaphoretic Resp:  Normal effort  MSK:   Moves extremities without difficulty  Other:  Left CVA lower abdominal tenderness  Medical Decision Making  Medically screening exam initiated at 6:41 AM.  Appropriate orders placed.  was informed that the remainder of the evaluation will be completed by another provider, this initial triage assessment does not replace that evaluation, and the importance of remaining in the ED until their evaluation is complete.  Concern for kidney stone.  Patient very diaphoretic.  Will obtain CBG.  Obtain labs and imaging.   Karen Chafe, MD 05/16/21 [phone]

## 2021-05-16 NOTE — Discharge Instructions (Signed)

## 2021-05-16 NOTE — ED Provider Notes (Signed)
Emergency Department Provider Note   I have reviewed the triage vital signs and the nursing notes.   HISTORY  Chief Complaint Flank Pain   HPI Blake Mcdaniel is a 35 y.o. male with past medical history reviewed below presents to the emergency department with acute onset left flank pain.  Symptoms began with having a bowel movement this morning.  He does have some urine hesitancy but denies dysuria or gross hematuria.  Denies any fevers or chills.  He has no prior history of similar pain.  No history of kidney stones.  No pain radiating up into the chest or shortness of breath.  Pain is severe and intermittent. No clear modifying factors.    Past Medical History:  Diagnosis Date  . Hypertension   . Left knee pain 08/10/2016  . Morbid obesity (HCC) 08/10/2016  . Sleep apnea, obstructive   . Snoring 08/10/2016    Patient Active Problem List   Diagnosis Date Noted  . At risk for extreme obesity with alveolar hypoventilation 03/25/2021  . Vivid dream 03/25/2021  . Chronic intermittent hypoxia with obstructive sleep apnea 03/25/2021  . Severe obstructive sleep apnea-hypopnea syndrome 03/25/2021  . Daytime somnolence 01/11/2021  . Right ankle pain 09/25/2019  . Annual physical exam 08/10/2016  . Morbid obesity (HCC) 08/10/2016  . Snoring 08/10/2016  . Left knee pain 08/10/2016    History reviewed. No pertinent surgical history.  Allergies Patient has no known allergies.  Family History  Problem Relation Age of Onset  . Heart attack Maternal Uncle   . Diabetes Maternal Grandfather     Social History Social History   Tobacco Use  . Smoking status: Current Every Day Smoker    Packs/day: 0.50    Types: Pipe  . Smokeless tobacco: Never Used  Vaping Use  . Vaping Use: Never used  Substance Use Topics  . Alcohol use: Not Currently  . Drug use: Not Currently    Review of Systems  Constitutional: No fever/chills Eyes: No visual changes. ENT: No sore  throat. Cardiovascular: Denies chest pain. Respiratory: Denies shortness of breath. Gastrointestinal: Positive left flank/abdominal pain.  No nausea, no vomiting.  No diarrhea.  No constipation. Genitourinary: Negative for dysuria. Positive urine hesitancy.  Musculoskeletal: Negative for back pain. Skin: Negative for rash. Neurological: Negative for headaches, focal weakness or numbness.  10-point ROS otherwise negative.  ____________________________________________   PHYSICAL EXAM:  VITAL SIGNS: ED Triage Vitals  Enc Vitals Group     BP 05/16/21 0631 (!) 159/101     Pulse Rate 05/16/21 0631 67     Resp 05/16/21 0631 20     Temp 05/16/21 0631 97.9 F (36.6 C)     Temp Source 05/16/21 0631 Oral     SpO2 05/16/21 0631 100 %     Weight 05/16/21 0630 300 lb (136.1 kg)     Height 05/16/21 0630 5\' 7"  (1.702 m)   Constitutional: Alert and oriented. Patient slightly diaphoretic and sitting up in bed. Looking uncomfortable.  Eyes: Conjunctivae are normal.  Head: Atraumatic. Nose: No congestion/rhinnorhea. Mouth/Throat: Mucous membranes are moist.  Neck: No stridor.   Cardiovascular: Normal rate, regular rhythm. Good peripheral circulation. Grossly normal heart sounds.   Respiratory: Normal respiratory effort.  No retractions. Lungs CTAB. Gastrointestinal: Soft and nontender. No distention.  Musculoskeletal: No lower extremity tenderness nor edema. No gross deformities of extremities. Neurologic:  Normal speech and language. No gross focal neurologic deficits are appreciated.  Skin:  Skin is warm, dry and intact.  No rash noted.   ____________________________________________   LABS (all labs ordered are listed, but only abnormal results are displayed)  Labs Reviewed  URINALYSIS, ROUTINE W REFLEX MICROSCOPIC - Abnormal; Notable for the following components:      Result Value   Hgb urine dipstick LARGE (*)    RBC / HPF >50 (*)    All other components within normal limits  CBC  WITH DIFFERENTIAL/PLATELET - Abnormal; Notable for the following components:   WBC 10.9 (*)    Neutro Abs 8.7 (*)    All other components within normal limits  BASIC METABOLIC PANEL - Abnormal; Notable for the following components:   Glucose, Bld 138 (*)    All other components within normal limits  CBG MONITORING, ED - Abnormal; Notable for the following components:   Glucose-Capillary 117 (*)    All other components within normal limits   ____________________________________________  EKG   EKG Interpretation  Date/Time:  Monday May 16 2021 07:31:38 EDT Ventricular Rate:  72 PR Interval:  142 QRS Duration: 102 QT Interval:  424 QTC Calculation: 464 R Axis:   22 Text Interpretation: Normal sinus rhythm Normal ECG No old tracing for comparison Confirmed by Alona Bene [phone]) on 05/16/2021 7:56:06 AM       ____________________________________________  RADIOLOGY  CT Renal Stone Study  Result Date: 05/16/2021 CLINICAL DATA:  Flank pain with kidney stone suspected EXAM: CT ABDOMEN AND PELVIS WITHOUT CONTRAST TECHNIQUE: Multidetector CT imaging of the abdomen and pelvis was performed following the standard protocol without IV contrast. COMPARISON:  None. FINDINGS: Lower chest:  No contributory findings. Hepatobiliary: No focal liver abnormality.No evidence of biliary obstruction or stone. Pancreas: Unremarkable. Spleen: Unremarkable. Adrenals/Urinary Tract: Negative adrenals. Mild left hydroureteronephrosis with perinephric and periureteric stranding. Mild low-density expansion of the left kidney. There is an underlying 2 mm calculus at the left UVJ. Negative collapsed bladder. Left upper pole renal calculus measuring 4 mm. Stomach/Bowel:  No obstruction. No appendicitis. Vascular/Lymphatic: No acute vascular abnormality. No mass or adenopathy. Reproductive:No pathologic findings. Other: No ascites or pneumoperitoneum. Musculoskeletal: No acute abnormalities. IMPRESSION: 1. Left urinary  obstruction from a 2 mm UVJ calculus. 2. 4 mm left renal calculus. Electronically Signed   By: Marnee Spring M.D.   On: 05/16/2021 07:44    ____________________________________________   PROCEDURES  Procedure(s) performed:   Procedures  None  ____________________________________________   INITIAL IMPRESSION / ASSESSMENT AND PLAN / ED COURSE  Pertinent labs & imaging results that were available during my care of the patient were reviewed by me and considered in my medical decision making (see chart for details).   Patient presents to the emergency department for cute onset left flank pain.  He appears uncomfortable and slightly diaphoretic.  No chest pain.  Presentation seems most consistent with ureteral stone.  Imaging and labs along with pain medication ordered prior to my evaluation.  Agree with work-up and will follow labs and imaging along with response to meds. Colitis is a consideration. Doubt vascular cause for flank pain.   CT confirms ureteral stone.  The stone is small and I suspect should pass on its own.  Patient to follow-up with urology in the coming week.  Plan for pain and nausea medication.  His pain is well controlled here.  No sign of infection in the urine. Will send for culture.  ____________________________________________  FINAL CLINICAL IMPRESSION(S) / ED DIAGNOSES  Final diagnoses:  Ureterolithiasis     MEDICATIONS GIVEN DURING THIS VISIT:  Medications  sodium chloride 0.9 % bolus 1,000 mL (0 mLs Intravenous Stopped 05/16/21 0930)  HYDROmorphone (DILAUDID) injection 1 mg (1 mg Intravenous Given 05/16/21 0713)  ketorolac (TORADOL) 30 MG/ML injection 30 mg (30 mg Intravenous Given 05/16/21 0712)  ondansetron (ZOFRAN) injection 4 mg (4 mg Intravenous Given 05/16/21 0711)     NEW OUTPATIENT MEDICATIONS STARTED DURING THIS VISIT:  Discharge Medication List as of 05/16/2021 10:13 AM    START taking these medications   Details  ibuprofen (ADVIL) 800 MG  tablet Take 1 tablet (800 mg total) by mouth every 8 (eight) hours as needed for moderate pain., Starting Mon 05/16/2021, Normal    oxyCODONE-acetaminophen (PERCOCET/ROXICET) 5-325 MG tablet Take 1 tablet by mouth every 6 (six) hours as needed for severe pain., Starting Mon 05/16/2021, Normal    senna-docusate (SENOKOT-S) 8.6-50 MG tablet Take 1 tablet by mouth at bedtime as needed for mild constipation or moderate constipation., Starting Mon 05/16/2021, Normal    tamsulosin (FLOMAX) 0.4 MG CAPS capsule Take 1 capsule (0.4 mg total) by mouth daily., Starting Mon 05/16/2021, Normal        Note:  This document was prepared using Dragon voice recognition software and may include unintentional dictation errors.  Alona Bene, MD, [facility]    Shailee Foots, Arlyss Repress, MD 05/19/21 [phone]

## 2021-05-17 ENCOUNTER — Telehealth: Payer: Self-pay | Admitting: General Practice

## 2021-05-17 NOTE — Telephone Encounter (Signed)
Transition Care Management Follow-up Telephone Call  Date of discharge and from where: Blake Mcdaniel 05/16/21  How have you been since you were released from the hospital? Doing ok. Ibuprofen is helping.   Any questions or concerns? No  Items Reviewed:  Did the pt receive and understand the discharge instructions provided? Yes   Medications obtained and verified? Yes   Other? No   Any new allergies since your discharge? No   Dietary orders reviewed? Yes  Do you have support at home? Yes   Home Care and Equipment/Supplies: Were home health services ordered? no  Functional Questionnaire: (I = Independent and D = Dependent) ADLs: I  Bathing/Dressing- I  Meal Prep- I  Eating- I  Maintaining continence- I  Transferring/Ambulation- I  Managing Meds- I  Follow up appointments reviewed:   PCP Hospital f/u appt confirmed? No  Patient stated that he is waiting for the stone to pass and then will call back to schedule an appointment.  Specialist Hospital f/u appt confirmed? No    Are transportation arrangements needed? No   If their condition worsens, is the pt aware to call PCP or go to the Emergency Dept.? Yes  Was the patient provided with contact information for the PCP's office or ED? Yes  Was to pt encouraged to call back with questions or concerns? Yes

## 2021-07-18 ENCOUNTER — Encounter: Payer: Self-pay | Admitting: Family Medicine

## 2021-07-18 ENCOUNTER — Ambulatory Visit (INDEPENDENT_AMBULATORY_CARE_PROVIDER_SITE_OTHER): Payer: 59 | Admitting: Family Medicine

## 2021-07-18 ENCOUNTER — Other Ambulatory Visit: Payer: Self-pay

## 2021-07-18 VITALS — BP 138/88 | HR 82 | Ht 68.0 in | Wt 300.0 lb

## 2021-07-18 DIAGNOSIS — G4733 Obstructive sleep apnea (adult) (pediatric): Secondary | ICD-10-CM

## 2021-07-18 DIAGNOSIS — Z9989 Dependence on other enabling machines and devices: Secondary | ICD-10-CM | POA: Diagnosis not present

## 2021-07-18 NOTE — Patient Instructions (Addendum)

## 2021-07-18 NOTE — Progress Notes (Signed)
PATIENT: Blake Mcdaniel DOB: [birth year]  REASON FOR VISIT: follow up HISTORY FROM: patient  Chief Complaint  Patient presents with   Follow-up    Pt alone, new room. Pt states overall he has done well on the machine. He found that that if the machine went over 15 cm water pressure he would have a hard time breathing. Pt decreased the max dose to 15 cm water pressure. Since he made that adjustment he has been using the machine without problems. DME. [employer] [facility])      HISTORY OF PRESENT ILLNESS: 07/18/21 ALL:  Blake Mcdaniel is a 35 y.o. male here today for follow up for OSA on CPAP. HST 02/2021 confirmed severe OSA with AHI of 51.8/hr with frequent brief desaturations. He was started on autoPAP with setting of 6-20cmH20. He reports doing well on CPAP. He did find a video on YouTube on how to adjust pressure settings on his machine as he felt that he would wake choking if pressure went above 15cmH20. He has not had any trouble since adjusting pressure to 6-15cmH20. He wakes better rested and feels he wakes easier. He is not as tired during the day.     HISTORY: (copied from Dr Dohmeier's previous note)  Blake Mcdaniel is a 35 y.o. Caucasian male patient was seen here upon referral on 02/14/2021 from PCP for an evaluation of sleep apnea, suspected to have OSA for about 4 years,  Loud snoring and witnessed apneas. Weight gain. He wakes up choking and with a very dry parched mouth. He has often a congested nose. He takes anti-allergy medication and quit smoking, has still occassionally sinusitis. Chief concern according to patient :  Pcp referred for SS. He was suppose to have this assessed some time ago but insurance/cost of study was more than could afford at time. Here to restart the process and complete the work up.   Blake Mcdaniel has a past medical history of Left knee pain (08/10/2016), Morbid obesity (HCC) (08/10/2016), and Snoring (08/10/2016). He is not vaccinated against  COVID 19.    Sleep relevant medical history: allergic rhinitis,  Nocturia once at night- not waking him, no deviated septum.  Family medical /sleep history: Father on CPAP with OSA.   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  They have a 37 year old boy. Tobacco use: quit 12 month ago 01-2020. ETOH use ; rare- last drink was 07-2020 , Caffeine intake in form of Coffee( /) Soda( 1 a day) Tea ( when eating out ) and has a morning time  energy drinks. 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 The patient goes to bed at 9 PM and continues to sleep for 3-4 hours, wakes for one bathroom break some nights The preferred sleep position is supine , with the support of 1-2 pillows.  Dreams are reportedly frequent/vivid- can have dreams early in the night and lucid  Dreams," can dream in installments" . Has had isolated sleep paralysis.  5.30 AM is the usual rise time. The patient wakes up at 3-5.30 with an alarm. On weekends he likes ot sleep longer but can get headaches.  He reports not feeling refreshed or restored in AM, with symptoms such as dry mouth , morning headaches, groggy, and residual fatigue.  Naps are avoided, but  he can fall asleep easily- , lasting from 15 minutes and are more refreshing than nocturnal sleep.   REVIEW OF SYSTEMS: Out of a complete 14 system review of symptoms, the patient complains only of the following symptoms, none and all other reviewed systems are negative.  ESS: 1/24, previously 14/24 FSS: not completed   ALLERGIES: No Known Allergies  HOME MEDICATIONS: Outpatient Medications Prior to Visit  Medication Sig Dispense Refill   AMBULATORY NON FORMULARY MEDICATION Supply ordered: CPAP and other supplies needed (headgear, cushions, filters, heated tuubing and water chamber) Dx: obstructive sleep  apnea Settings: auto-titration pressure range from 6-20 cm water , 2 cm EPR ,and heated humidification and mask of choice ( facial hair to be considered). 1 Units prn   cyclobenzaprine (FLEXERIL) 10 MG tablet TAKE 1/2 TO 1 TABLET 3 TIMES A DAY AS NEEDED MUSCLE SPASMS 90 tablet 0   ibuprofen (ADVIL) 800 MG tablet Take 1 tablet (800 mg total) by mouth every 8 (eight) hours as needed for moderate pain. 21 tablet 0   losartan (COZAAR) 50 MG tablet Take 1 tablet (50 mg total) by mouth daily. 90 tablet 3   montelukast (SINGULAIR) 10 MG tablet TAKE 1 TABLET BY MOUTH EVERYDAY AT BEDTIME (Patient taking differently: Take 10 mg by mouth daily as needed.) 90 tablet 1   senna-docusate (SENOKOT-S) 8.6-50 MG tablet Take 1 tablet by mouth at bedtime as needed for mild constipation or moderate constipation. 30 tablet 0   tamsulosin (FLOMAX) 0.4 MG CAPS capsule Take 1 capsule (0.4 mg total) by mouth daily. 30 capsule 0   fluticasone (FLONASE) 50 MCG/ACT nasal spray SPRAY 2 SPRAYS INTO EACH NOSTRIL EVERY DAY 48 mL 2   oxyCODONE-acetaminophen (PERCOCET/ROXICET) 5-325 MG tablet Take 1 tablet by mouth every 6 (six) hours as needed for severe pain. 12 tablet 0   No facility-administered medications prior to visit.    PAST MEDICAL HISTORY: Past Medical History:  Diagnosis Date   Hypertension    Left knee pain 08/10/2016   Morbid obesity (HCC) 08/10/2016   Sleep apnea, obstructive    Snoring 08/10/2016    PAST SURGICAL HISTORY: No past surgical history on file.  FAMILY HISTORY: Family History  Problem Relation Age of Onset   Heart attack Maternal Uncle    Diabetes Maternal Grandfather     SOCIAL HISTORY: Social History   Socioeconomic History   Marital status: Single    Spouse name: Not on file   Number of children: Not on file   Years of education: Not on file   Highest education level: Not on file  Occupational History   Not on file  Tobacco Use   Smoking status: Every Day    Packs/day: 0.50     Types: Pipe, Cigarettes   Smokeless tobacco: Never  Vaping Use   Vaping Use: Never used  Substance and Sexual Activity   Alcohol use: Not Currently   Drug use: Not Currently   Sexual activity: Yes    Partners: Female    Birth control/protection: Condom  Other Topics Concern   Not on file  Social History Narrative   Not on file   Social Determinants of Health   Financial Resource Strain: Not on file  Food Insecurity: Not on file  Transportation Needs: Not on file  Physical Activity: Not on file  Stress: Not on file  Social Connections: Not on file  Intimate Partner Violence: Not on file     PHYSICAL EXAM  Vitals:   07/18/21 1452  BP: 138/88  Pulse: 82  Weight: 300 lb (136.1 kg)  Height: 5\' 8"  (1.727 m)   Body mass index is 45.61 kg/m.  Generalized: Well developed, in no acute distress  Cardiology: normal rate and rhythm, no murmur noted Respiratory: clear to auscultation bilaterally  Neurological examination  Mentation: Alert oriented to time, place, history taking. Follows all commands speech and language fluent Cranial nerve II-XII: Pupils were equal round reactive to light. Extraocular movements were full, visual field were full  Motor: The motor testing reveals 5 over 5 strength of all 4 extremities. Good symmetric motor tone is noted throughout.  Gait and station: Gait is normal.    DIAGNOSTIC DATA (LABS, IMAGING, TESTING) - I reviewed patient records, labs, notes, testing and imaging myself where available.  No flowsheet data found.   Lab Results  Component Value Date   WBC 10.9 (H) 05/16/2021   HGB 16.2 05/16/2021   HCT 48.1 05/16/2021   MCV 92.9 05/16/2021   PLT 186 05/16/2021      Component Value Date/Time   NA 139 05/16/2021 0722   K 3.7 05/16/2021 0722   CL 104 05/16/2021 0722   CO2 28 05/16/2021 0722   GLUCOSE 138 (H) 05/16/2021 0722   BUN 13 05/16/2021 0722   CREATININE 1.05 05/16/2021 0722   CREATININE 1.02 06/10/2020 0716    CALCIUM 9.2 05/16/2021 0722   PROT 6.8 06/10/2020 0716   ALBUMIN 4.4 08/10/2016 1544   AST 25 06/10/2020 0716   ALT 42 06/10/2020 0716   ALKPHOS 62 08/10/2016 1544   BILITOT 0.7 06/10/2020 0716   GFRNONAA >60 05/16/2021 0722   GFRNONAA 95 06/10/2020 0716   GFRAA 110 06/10/2020 0716   Lab Results  Component Value Date   CHOL 181 06/10/2020   HDL 36 (L) 06/10/2020   LDLCALC 120 (H) 06/10/2020   TRIG 135 06/10/2020   CHOLHDL 5.0 (H) 06/10/2020   Lab Results  Component Value Date   HGBA1C 4.9 08/10/2016   No results found for: 08/12/2016 Lab Results  Component Value Date   TSH 6.01 (H) 06/10/2020     ASSESSMENT AND PLAN 36 y.o. year old male  has a past medical history of Hypertension, Left knee pain (08/10/2016), Morbid obesity (HCC) (08/10/2016), Sleep apnea, obstructive, and Snoring (08/10/2016). here with     ICD-10-CM   1. OSA on CPAP  G47.33    Z99.89         Hakim Minniefield is doing well on CPAP therapy. Compliance report reveals excellent compliance. He was encouraged to continue using CPAP nightly and for greater than 4 hours each night. I have asked that he contact me should he feel pressure setting need to be adjusted so that I can assist with this process. Review of previous AHI and current with new settings show his apnea is well managed. Risks of untreated sleep apnea review and education materials provided. Healthy lifestyle habits encouraged. He will follow up in 1 year, sooner if needed. He verbalizes understanding and agreement with this plan.    No orders of the defined types were placed in this encounter.    No orders of the defined types were placed in this encounter.     Blake Chafe, FNP-C 07/18/2021, 3:42 PM [facility] [address] [place], [place] [phone]

## 2021-08-16 ENCOUNTER — Other Ambulatory Visit: Payer: Self-pay | Admitting: Osteopathic Medicine

## 2021-08-30 ENCOUNTER — Other Ambulatory Visit: Payer: Self-pay | Admitting: Osteopathic Medicine

## 2022-05-17 IMAGING — CT CT RENAL STONE PROTOCOL
2 of 4 series · 17 of 46 positions shown, 19 images · non-contrast
Comparison: None.

CLINICAL DATA: Flank pain with kidney stone suspected

EXAM:
CT ABDOMEN AND PELVIS WITHOUT CONTRAST
TECHNIQUE: Multidetector CT imaging of the abdomen and pelvis was performed
following the standard protocol without IV contrast.

[Series 2: axial st · axial · 0.86mm/px · z∈[-742,-292]mm · 14 of 102 slices shown, 16 images]
[im 6/102  soft-tissue]
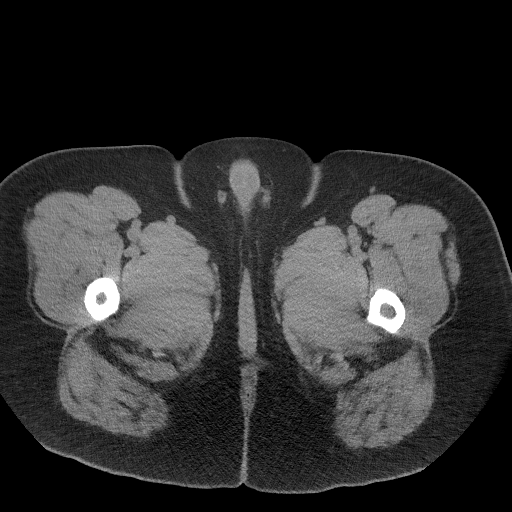
[im 6/102  bone]
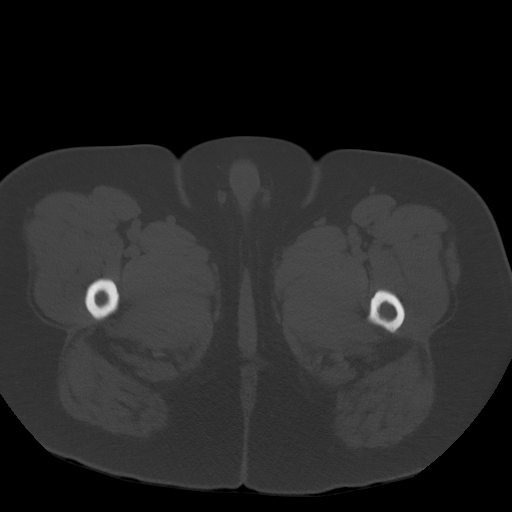
[im 11/102  soft-tissue]
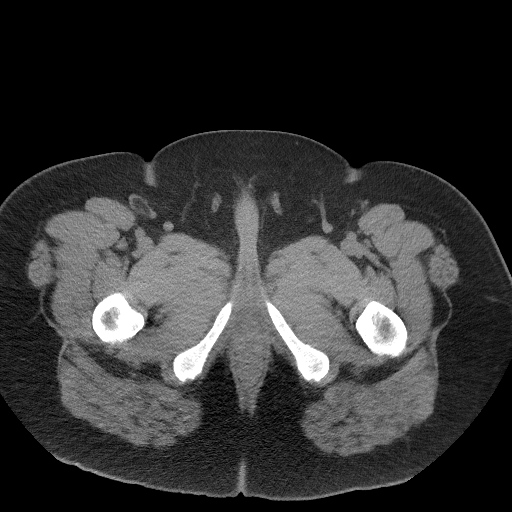
[im 22/102  soft-tissue]
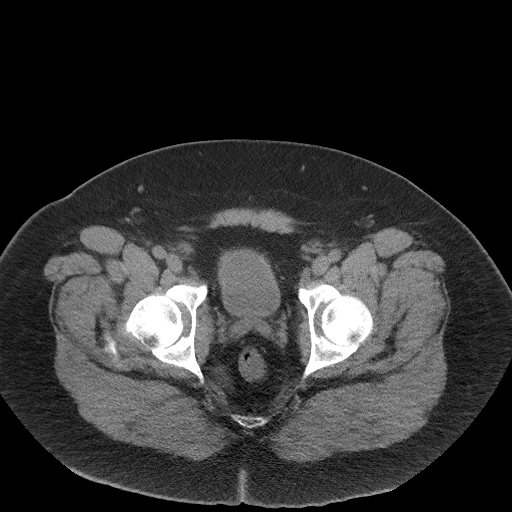
[im 27/102  soft-tissue]
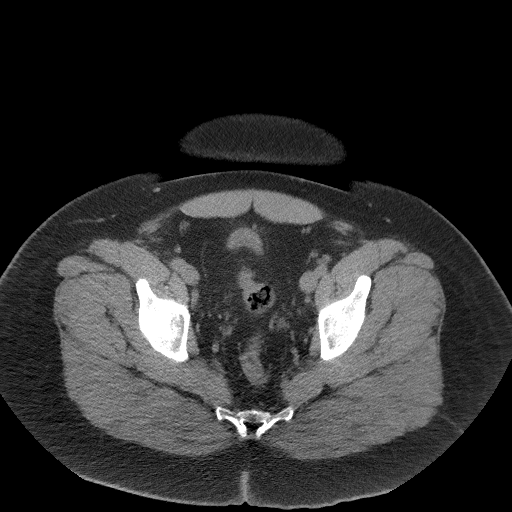
[im 32/102  soft-tissue]
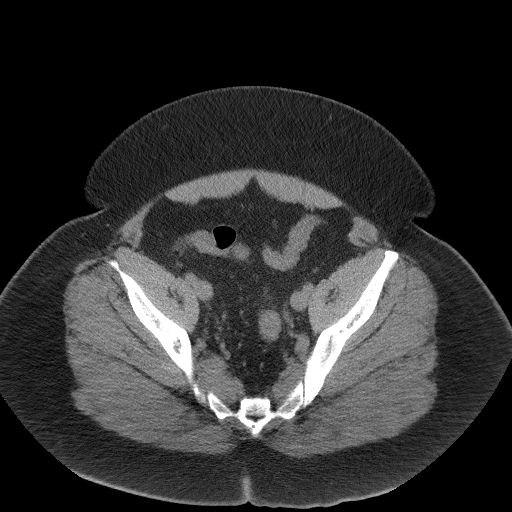
[im 43/102  soft-tissue]
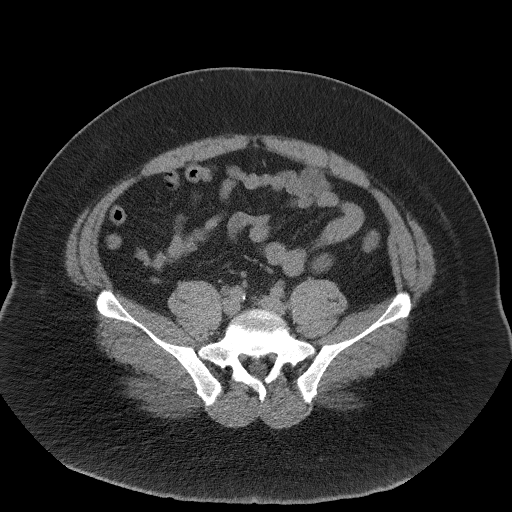
[im 48/102  soft-tissue]
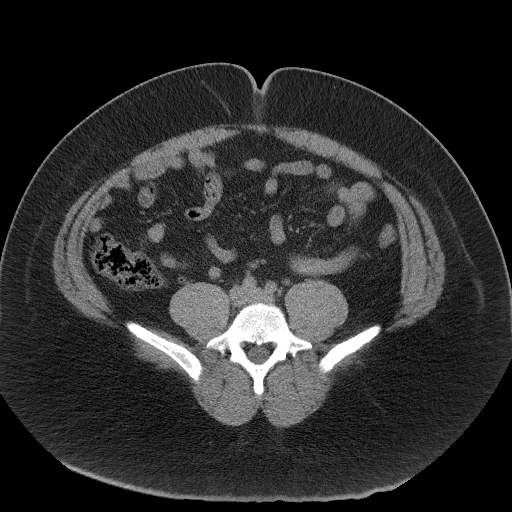
[im 54/102  soft-tissue]
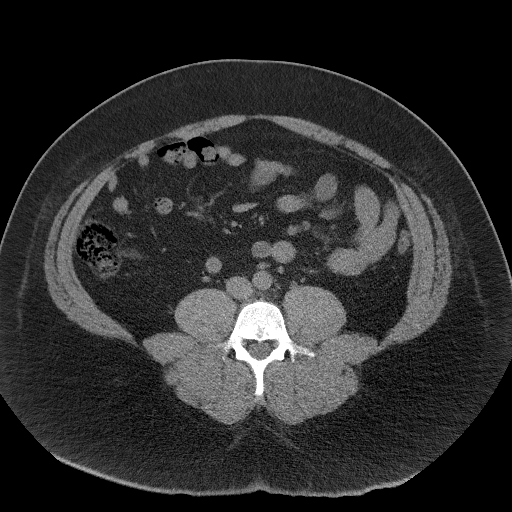
[im 59/102  soft-tissue]
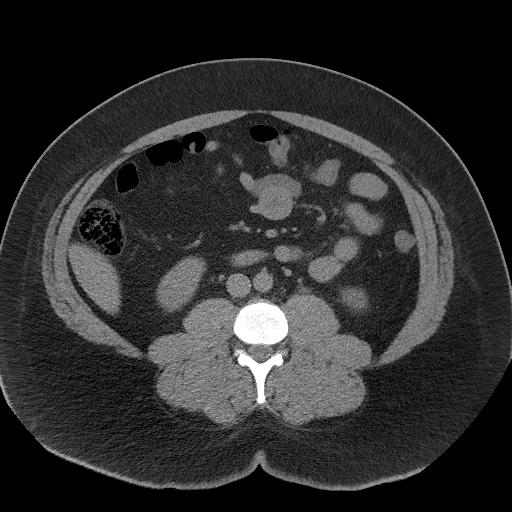
[im 59/102  bone]
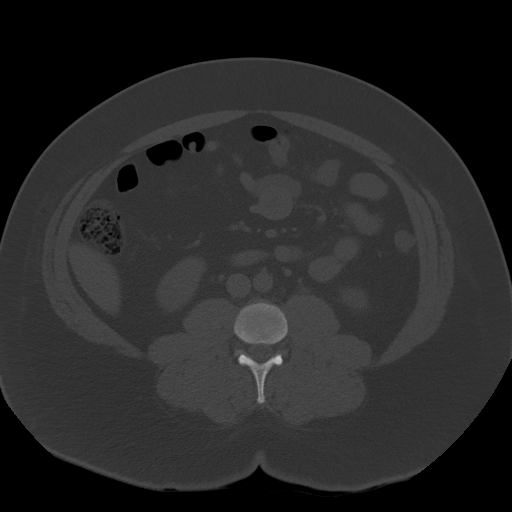
[im 70/102  soft-tissue]
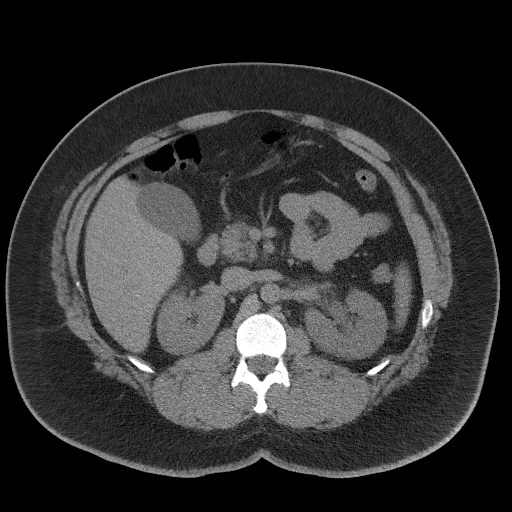
[im 75/102  soft-tissue]
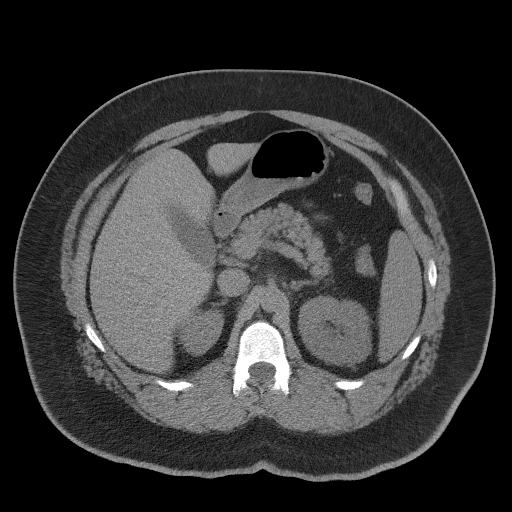
[im 80/102  soft-tissue]
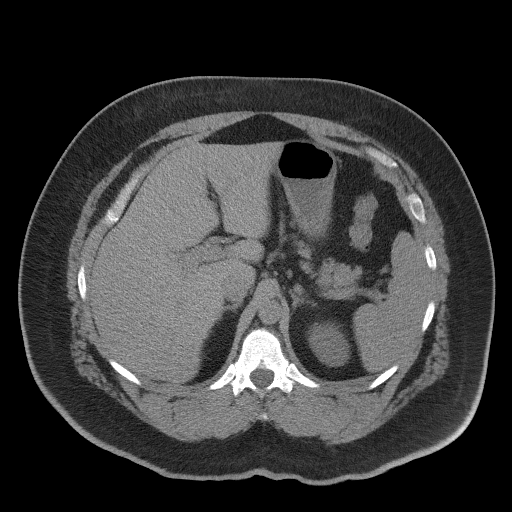
[im 91/102  soft-tissue]
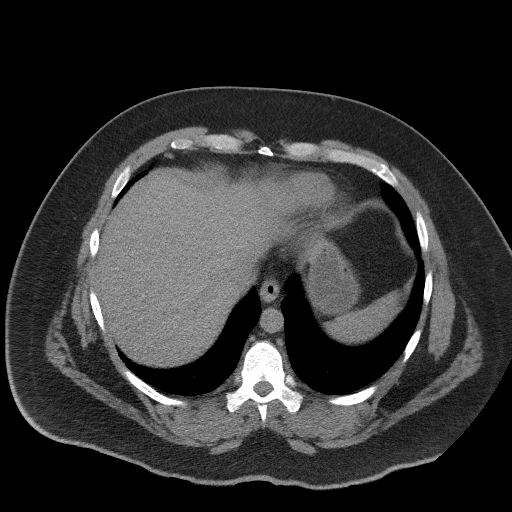
[im 96/102  soft-tissue]
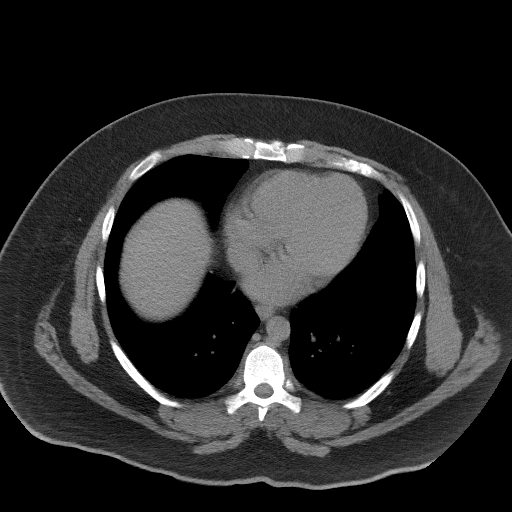

[Series 5: coronal st · coronal · 0.89mm/px · 3 of 122 slices shown]
[im 41/122  soft-tissue]
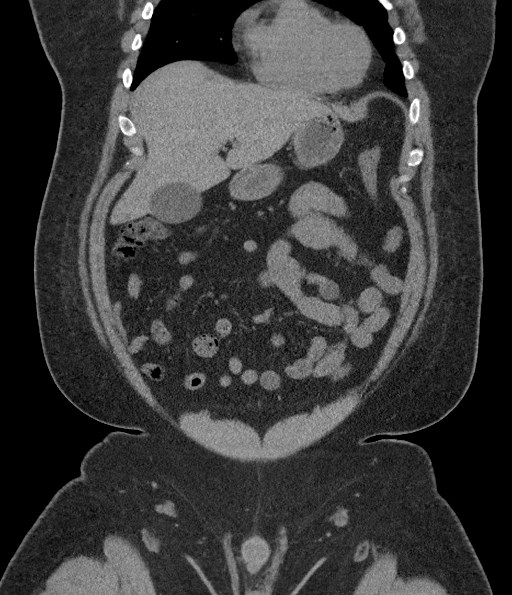
[im 54/122  soft-tissue]
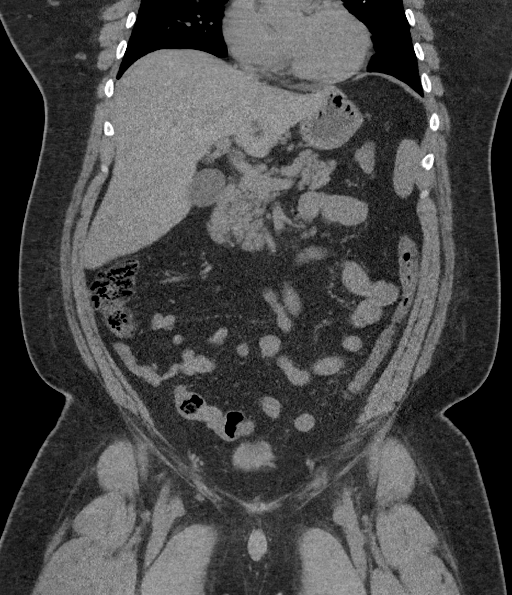
[im 68/122  soft-tissue]
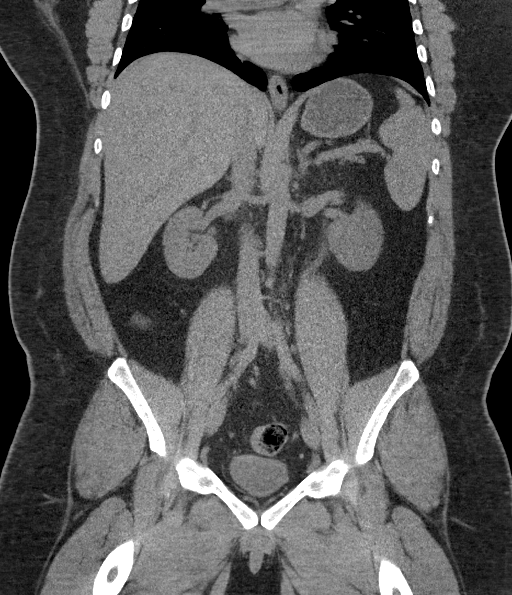

[17 of 46 positions shown; findings below may reference images not displayed]

FINDINGS: Lower chest:  No contributory findings.

Hepatobiliary: No focal liver abnormality.No evidence of biliary
obstruction or stone.

Pancreas: Unremarkable.

Spleen: Unremarkable.

Adrenals/Urinary Tract: Negative adrenals. Mild left
hydroureteronephrosis with perinephric and periureteric stranding.
Mild low-density expansion of the left kidney. There is an
underlying 2 mm calculus at the left UVJ. Negative collapsed
bladder. Left upper pole renal calculus measuring 4 mm.

Stomach/Bowel:  No obstruction. No appendicitis.

Vascular/Lymphatic: No acute vascular abnormality. No mass or
adenopathy.

Reproductive:No pathologic findings.

Other: No ascites or pneumoperitoneum.

Musculoskeletal: No acute abnormalities.
IMPRESSION: 1. Left urinary obstruction from a 2 mm UVJ calculus.
2. 4 mm left renal calculus.

## 2022-07-24 ENCOUNTER — Ambulatory Visit: Payer: 59 | Admitting: Family Medicine

## 2022-07-24 ENCOUNTER — Encounter: Payer: Self-pay | Admitting: Family Medicine

## 2022-07-24 VITALS — BP 139/71 | HR 109 | Ht 68.0 in | Wt 316.0 lb

## 2022-07-24 DIAGNOSIS — G4733 Obstructive sleep apnea (adult) (pediatric): Secondary | ICD-10-CM

## 2022-07-24 DIAGNOSIS — Z9989 Dependence on other enabling machines and devices: Secondary | ICD-10-CM

## 2022-07-24 NOTE — Patient Instructions (Signed)

## 2022-07-24 NOTE — Progress Notes (Signed)
PATIENT: Blake Mcdaniel DOB: 12/24/1985  REASON FOR VISIT: follow up HISTORY FROM: patient  Chief Complaint  Patient presents with   Obstructive Sleep Apnea    Room 13,  Doing well on cpap     HISTORY OF PRESENT ILLNESS:  07/24/22 ALL:  Blake Mcdaniel presents today for follow up for OSA on CPAP. He continues to do well on therapy. He is using CPAP nightly for about 7 hours. He denies concerns with machine or supplies. He does mention needing to change his mask. He notes that it leaks more with facial hair. Apnea is well managed.     07/18/2021 ALL: Blake Mcdaniel is a 36 y.o. male here today for follow up for OSA on CPAP. HST 02/2021 confirmed severe OSA with AHI of 51.8/hr with frequent brief desaturations. He was started on autoPAP with setting of 6-20cmH20. He reports doing well on CPAP. He did find a video on YouTube on how to adjust pressure settings on his machine as he felt that he would wake choking if pressure went above 15cmH20. He has not had any trouble since adjusting pressure to 6-15cmH20. He wakes better rested and feels he wakes easier. He is not as tired during the day.     HISTORY: (copied from Dr Dohmeier's previous note)  Blake Mcdaniel is a 36 y.o. Caucasian male patient was seen here upon referral on 02/14/2021 from PCP for an evaluation of sleep apnea, suspected to have OSA for about 4 years,  Loud snoring and witnessed apneas. Weight gain. He wakes up choking and with a very dry parched mouth. He has often a congested nose. He takes anti-allergy medication and quit smoking, has still occassionally sinusitis. Chief concern according to patient :  Pcp referred for SS. He was suppose to have this assessed some time ago but insurance/cost of study was more than could afford at time. Here to restart the process and complete the work up.   Blake Mcdaniel has a past medical history of Left knee pain (08/10/2016), Morbid obesity (HCC) (08/10/2016), and Snoring (08/10/2016). He  is not vaccinated against COVID 19.    Sleep relevant medical history: allergic rhinitis,  Nocturia once at night- not waking him, no deviated septum.  Family medical /sleep history: Father on CPAP with OSA.   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  They have a 38 year old boy. Tobacco use: quit 12 month ago 01-2020. ETOH use ; rare- last drink was 07-2020 , Caffeine intake in form of Coffee( /) Soda( 1 a day) Tea ( when eating out ) and has a morning time  energy drinks. 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 The patient goes to bed at 9 PM and continues to sleep for 3-4 hours, wakes for one bathroom break some nights The preferred sleep position is supine , with the support of 1-2 pillows.  Dreams are reportedly frequent/vivid- can have dreams early in the night and lucid  Dreams," can dream in installments" . Has had isolated sleep paralysis.  5.30 AM is the usual rise time. The patient wakes up at 3-5.30 with an alarm. On weekends he likes ot sleep longer but can get headaches.  He reports not feeling refreshed or restored in AM, with symptoms such as dry mouth , morning headaches, groggy,  and residual fatigue.  Naps are avoided, but he can fall asleep easily- , lasting from 15 minutes and are more refreshing than nocturnal sleep.   REVIEW OF SYSTEMS: Out of a complete 14 system review of symptoms, the patient complains only of the following symptoms, none and all other reviewed systems are negative.  ESS: 1/24, previously 14/24 FSS: 33/63  ALLERGIES: No Known Allergies  HOME MEDICATIONS: Outpatient Medications Prior to Visit  Medication Sig Dispense Refill   AMBULATORY NON FORMULARY MEDICATION Supply ordered: CPAP and other supplies needed (headgear, cushions, filters, heated tuubing and water chamber) Dx:  obstructive sleep apnea Settings: auto-titration pressure range from 6-20 cm water , 2 cm EPR ,and heated humidification and mask of choice ( facial hair to be considered). 1 Units prn   cyclobenzaprine (FLEXERIL) 10 MG tablet TAKE 1/2 TO 1 TABLET 3 TIMES A DAY AS NEEDED MUSCLE SPASMS 90 tablet 0   montelukast (SINGULAIR) 10 MG tablet TAKE 1 TABLET BY MOUTH EVERYDAY AT BEDTIME (Patient taking differently: Take 10 mg by mouth daily as needed.) 90 tablet 1   ibuprofen (ADVIL) 800 MG tablet Take 1 tablet (800 mg total) by mouth every 8 (eight) hours as needed for moderate pain. 21 tablet 0   losartan (COZAAR) 50 MG tablet Take 1 tablet (50 mg total) by mouth daily. Needs appointment for future refills. 30 tablet 0   senna-docusate (SENOKOT-S) 8.6-50 MG tablet Take 1 tablet by mouth at bedtime as needed for mild constipation or moderate constipation. 30 tablet 0   tamsulosin (FLOMAX) 0.4 MG CAPS capsule Take 1 capsule (0.4 mg total) by mouth daily. 30 capsule 0   No facility-administered medications prior to visit.    PAST MEDICAL HISTORY: Past Medical History:  Diagnosis Date   Hypertension    Left knee pain 08/10/2016   Morbid obesity (HCC) 08/10/2016   Sleep apnea, obstructive    Snoring 08/10/2016    PAST SURGICAL HISTORY: History reviewed. No pertinent surgical history.  FAMILY HISTORY: Family History  Problem Relation Age of Onset   Heart attack Maternal Uncle    Diabetes Maternal Grandfather     SOCIAL HISTORY: Social History   Socioeconomic History   Marital status: Single    Spouse name: Not on file   Number of children: Not on file   Years of education: Not on file   Highest education level: Not on file  Occupational History   Not on file  Tobacco Use   Smoking status: Every Day    Packs/day: 0.50    Types: Pipe, Cigarettes   Smokeless tobacco: Never  Vaping Use   Vaping Use: Never used  Substance and Sexual Activity   Alcohol use: Not Currently   Drug use: Not  Currently   Sexual activity: Yes    Partners: Female    Birth control/protection: Condom  Other Topics Concern   Not on file  Social History Narrative   Not on file   Social Determinants of Health   Financial Resource Strain: Not on file  Food Insecurity: Not on file  Transportation Needs: Not on file  Physical Activity: Not on file  Stress: Not on file  Social Connections: Not on file  Intimate Partner Violence: Not on file     PHYSICAL EXAM  Vitals:   07/24/22 1506  BP: 139/71  Pulse: (!) 109  Weight: (!) 316 lb (143.3 kg)  Height: 5\' 8"  (1.727 m)   Body mass index is 48.05 kg/m.  Generalized: Well developed,  in no acute distress  Cardiology: normal rate and rhythm, no murmur noted Respiratory: clear to auscultation bilaterally  Neurological examination  Mentation: Alert oriented to time, place, history taking. Follows all commands speech and language fluent Cranial nerve II-XII: Pupils were equal round reactive to light. Extraocular movements were full, visual field were full  Motor: The motor testing reveals 5 over 5 strength of all 4 extremities. Good symmetric motor tone is noted throughout.  Gait and station: Gait is normal.    DIAGNOSTIC DATA (LABS, IMAGING, TESTING) - I reviewed patient records, labs, notes, testing and imaging myself where available.      No data to display           Lab Results  Component Value Date   WBC 10.9 (H) 05/16/2021   HGB 16.2 05/16/2021   HCT 48.1 05/16/2021   MCV 92.9 05/16/2021   PLT 186 05/16/2021      Component Value Date/Time   NA 139 05/16/2021 0722   K 3.7 05/16/2021 0722   CL 104 05/16/2021 0722   CO2 28 05/16/2021 0722   GLUCOSE 138 (H) 05/16/2021 0722   BUN 13 05/16/2021 0722   CREATININE 1.05 05/16/2021 0722   CREATININE 1.02 06/10/2020 0716   CALCIUM 9.2 05/16/2021 0722   PROT 6.8 06/10/2020 0716   ALBUMIN 4.4 08/10/2016 1544   AST 25 06/10/2020 0716   ALT 42 06/10/2020 0716   ALKPHOS 62  08/10/2016 1544   BILITOT 0.7 06/10/2020 0716   GFRNONAA >60 05/16/2021 0722   GFRNONAA 95 06/10/2020 0716   GFRAA 110 06/10/2020 0716   Lab Results  Component Value Date   CHOL 181 06/10/2020   HDL 36 (L) 06/10/2020   LDLCALC 120 (H) 06/10/2020   TRIG 135 06/10/2020   CHOLHDL 5.0 (H) 06/10/2020   Lab Results  Component Value Date   HGBA1C 4.9 08/10/2016   No results found for: "VITAMINB12" Lab Results  Component Value Date   TSH 6.01 (H) 06/10/2020     ASSESSMENT AND PLAN 36 y.o. year old male  has a past medical history of Hypertension, Left knee pain (08/10/2016), Morbid obesity (HCC) (08/10/2016), Sleep apnea, obstructive, and Snoring (08/10/2016). here with     ICD-10-CM   1. OSA on CPAP  G47.33 For home use only DME continuous positive airway pressure (CPAP)   Z99.89         Blake Mcdaniel is doing well on CPAP therapy. Compliance report reveals excellent compliance. He was encouraged to continue using CPAP nightly and for greater than 4 hours each night. Risks of untreated sleep apnea review and education materials provided. Healthy lifestyle habits encouraged. He will follow up in 1 year, sooner if needed. He verbalizes understanding and agreement with this plan.    Orders Placed This Encounter  Procedures   For home use only DME continuous positive airway pressure (CPAP)    Supplies    Order Specific Question:   Length of Need    Answer:   Lifetime    Order Specific Question:   Patient has OSA or probable OSA    Answer:   Yes    Order Specific Question:   Is the patient currently using CPAP in the home    Answer:   Yes    Order Specific Question:   Settings    Answer:   Other see comments    Order Specific Question:   CPAP supplies needed    Answer:   Mask, headgear, cushions, filters, heated  tubing and water chamber      No orders of the defined types were placed in this encounter.      Shawnie Dapper, FNP-C 07/24/2022, 3:42 PM [facility] [address], [address] [place], [place] [address] [phone]

## 2022-07-25 NOTE — Progress Notes (Signed)
CM sent to [facility] for new order ?

## 2023-07-19 NOTE — Progress Notes (Deleted)
PATIENT: Blake Mcdaniel DOB: [birth year]  REASON FOR VISIT: follow up HISTORY FROM: patient  No chief complaint on file.    HISTORY OF PRESENT ILLNESS:  07/19/23 ALL:  Blake Mcdaniel returns for follow up for OSA on CPAP.   07/24/2022 ALL: Blake Mcdaniel presents today for follow up for OSA on CPAP. He continues to do well on therapy. He is using CPAP nightly for about 7 hours. He denies concerns with machine or supplies. He does mention needing to change his mask. He notes that it leaks more with facial hair. Apnea is well managed.     07/18/2021 ALL: Blake Mcdaniel is a 37 y.o. male here today for follow up for OSA on CPAP. HST 02/2021 confirmed severe OSA with AHI of 51.8/hr with frequent brief desaturations. He was started on autoPAP with setting of 6-20cmH20. He reports doing well on CPAP. He did find a video on YouTube on how to adjust pressure settings on his machine as he felt that he would wake choking if pressure went above 15cmH20. He has not had any trouble since adjusting pressure to 6-15cmH20. He wakes better rested and feels he wakes easier. He is not as tired during the day.     HISTORY: (copied from Dr Dohmeier's previous note)  Blake Mcdaniel is a 37 y.o. Caucasian male patient was seen here upon referral on 02/14/2021 from PCP for an evaluation of sleep apnea, suspected to have OSA for about 4 years,  Loud snoring and witnessed apneas. Weight gain. He wakes up choking and with a very dry parched mouth. He has often a congested nose. He takes anti-allergy medication and quit smoking, has still occassionally sinusitis. Chief concern according to patient :  Pcp referred for SS. He was suppose to have this assessed some time ago but insurance/cost of study was more than could afford at time. Here to restart the process and complete the work up.   Blake Mcdaniel has a past medical history of Left knee pain (08/10/2016), Morbid obesity (HCC) (08/10/2016), and Snoring (08/10/2016). He is not  vaccinated against COVID 19.    Sleep relevant medical history: allergic rhinitis,  Nocturia once at night- not waking him, no deviated septum.  Family medical /sleep history: Father on CPAP with OSA.   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  They have a 39 year old boy. Tobacco use: quit 12 month ago 01-2020. ETOH use ; rare- last drink was 07-2020 , Caffeine intake in form of Coffee( /) Soda( 1 a day) Tea ( when eating out ) and has a morning time  energy drinks. 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 The patient goes to bed at 9 PM and continues to sleep for 3-4 hours, wakes for one bathroom break some nights The preferred sleep position is supine , with the support of 1-2 pillows.  Dreams are reportedly frequent/vivid- can have dreams early in the night and lucid  Dreams," can dream in installments" . Has had isolated sleep paralysis.  5.30 AM is the usual rise time. The patient wakes up at 3-5.30 with an alarm. On weekends he likes ot sleep longer but can get headaches.  He reports not feeling refreshed or restored in AM, with symptoms such as dry mouth , morning headaches, groggy, and residual fatigue.  Naps are avoided, but he can fall asleep easily- , lasting from 15 minutes and are more refreshing than nocturnal sleep.   REVIEW OF SYSTEMS: Out of a complete 14 system review of symptoms, the patient complains only of the following symptoms, none and all other reviewed systems are negative.  ESS: 1/24, previously 14/24 FSS: 33/63  ALLERGIES: No Known Allergies  HOME MEDICATIONS: Outpatient Medications Prior to Visit  Medication Sig Dispense Refill   AMBULATORY NON FORMULARY MEDICATION Supply ordered: CPAP and other supplies needed (headgear, cushions, filters, heated tuubing and water chamber) Dx:  obstructive sleep apnea Settings: auto-titration pressure range from 6-20 cm water , 2 cm EPR ,and heated humidification and mask of choice ( facial hair to be considered). 1 Units prn   cyclobenzaprine (FLEXERIL) 10 MG tablet TAKE 1/2 TO 1 TABLET 3 TIMES A DAY AS NEEDED MUSCLE SPASMS 90 tablet 0   montelukast (SINGULAIR) 10 MG tablet TAKE 1 TABLET BY MOUTH EVERYDAY AT BEDTIME (Patient taking differently: Take 10 mg by mouth daily as needed.) 90 tablet 1   No facility-administered medications prior to visit.    PAST MEDICAL HISTORY: Past Medical History:  Diagnosis Date   Hypertension    Left knee pain 08/10/2016   Morbid obesity (HCC) 08/10/2016   Sleep apnea, obstructive    Snoring 08/10/2016    PAST SURGICAL HISTORY: No past surgical history on file.  FAMILY HISTORY: Family History  Problem Relation Age of Onset   Heart attack Maternal Uncle    Diabetes Maternal Grandfather     SOCIAL HISTORY: Social History   Socioeconomic History   Marital status: Single    Spouse name: Not on file   Number of children: Not on file   Years of education: Not on file   Highest education level: Not on file  Occupational History   Not on file  Tobacco Use   Smoking status: Every Day    Current packs/day: 0.50    Types: Pipe, Cigarettes   Smokeless tobacco: Never  Vaping Use   Vaping status: Never Used  Substance and Sexual Activity   Alcohol use: Not Currently   Drug use: Not Currently   Sexual activity: Yes    Partners: Female    Birth control/protection: Condom  Other Topics Concern   Not on file  Social History Narrative   Not on file   Social Determinants of Health   Financial Resource Strain: Not on file  Food Insecurity: Not on file  Transportation Needs: Not on file  Physical Activity: Not on file  Stress: Not on file  Social Connections: Unknown (05/01/2022)   Received from [facility], [facility]   Social Network    Social Network: Not on file  Intimate  Partner Violence: Unknown (03/23/2022)   Received from [facility], [facility]   HITS    Physically Hurt: Not on file    Insult or Talk Down To: Not on file    Threaten Physical Harm: Not on file    Scream or Curse: Not on file     PHYSICAL EXAM  There were no vitals filed for this visit.  There is no height or weight on file to calculate BMI.  Generalized: Well developed, in no acute distress  Cardiology: normal rate and rhythm, no murmur noted Respiratory: clear to auscultation bilaterally  Neurological examination  Mentation: Alert oriented to time, place, history taking. Follows all commands speech and language fluent Cranial nerve II-XII: Pupils were equal round reactive to  light. Extraocular movements were full, visual field were full  Motor: The motor testing reveals 5 over 5 strength of all 4 extremities. Good symmetric motor tone is noted throughout.  Gait and station: Gait is normal.    DIAGNOSTIC DATA (LABS, IMAGING, TESTING) - I reviewed patient records, labs, notes, testing and imaging myself where available.      No data to display           Lab Results  Component Value Date   WBC 10.9 (H) 05/16/2021   HGB 16.2 05/16/2021   HCT 48.1 05/16/2021   MCV 92.9 05/16/2021   PLT 186 05/16/2021      Component Value Date/Time   NA 139 05/16/2021 0722   K 3.7 05/16/2021 0722   CL 104 05/16/2021 0722   CO2 28 05/16/2021 0722   GLUCOSE 138 (H) 05/16/2021 0722   BUN 13 05/16/2021 0722   CREATININE 1.05 05/16/2021 0722   CREATININE 1.02 06/10/2020 0716   CALCIUM 9.2 05/16/2021 0722   PROT 6.8 06/10/2020 0716   ALBUMIN 4.4 08/10/2016 1544   AST 25 06/10/2020 0716   ALT 42 06/10/2020 0716   ALKPHOS 62 08/10/2016 1544   BILITOT 0.7 06/10/2020 0716   GFRNONAA >60 05/16/2021 0722   GFRNONAA 95 06/10/2020 0716   GFRAA 110 06/10/2020 0716   Lab Results  Component Value Date   CHOL 181 06/10/2020   HDL 36 (L) 06/10/2020   LDLCALC 120 (H) 06/10/2020    TRIG 135 06/10/2020   CHOLHDL 5.0 (H) 06/10/2020   Lab Results  Component Value Date   HGBA1C 4.9 08/10/2016   No results found for: "VITAMINB12" Lab Results  Component Value Date   TSH 6.01 (H) 06/10/2020     ASSESSMENT AND PLAN 37 y.o. year old male  has a past medical history of Hypertension, Left knee pain (08/10/2016), Morbid obesity (HCC) (08/10/2016), Sleep apnea, obstructive, and Snoring (08/10/2016). here with   No diagnosis found.     Blake Mcdaniel is doing well on CPAP therapy. Compliance report reveals excellent compliance. He was encouraged to continue using CPAP nightly and for greater than 4 hours each night. Risks of untreated sleep apnea review and education materials provided. Healthy lifestyle habits encouraged. He will follow up in 1 year, sooner if needed. He verbalizes understanding and agreement with this plan.    No orders of the defined types were placed in this encounter.     No orders of the defined types were placed in this encounter.      Shawnie Dapper, FNP-C 07/19/2023, 9:19 AM [facility] [address] [place], [place] [address] [phone]

## 2023-07-23 ENCOUNTER — Ambulatory Visit: Payer: Self-pay | Admitting: Family Medicine

## 2023-07-23 DIAGNOSIS — G4733 Obstructive sleep apnea (adult) (pediatric): Secondary | ICD-10-CM
